# Patient Record
Sex: Male | Born: 1950
Health system: Southern US, Community
[De-identification: ages and names within clinical notes are randomized; demographics above are authoritative.]

## PROBLEM LIST (undated history)

## (undated) DIAGNOSIS — E119 Type 2 diabetes mellitus without complications: Secondary | ICD-10-CM

## (undated) DIAGNOSIS — K759 Inflammatory liver disease, unspecified: Secondary | ICD-10-CM

## (undated) DIAGNOSIS — Z87442 Personal history of urinary calculi: Secondary | ICD-10-CM

## (undated) DIAGNOSIS — Z889 Allergy status to unspecified drugs, medicaments and biological substances status: Secondary | ICD-10-CM

## (undated) DIAGNOSIS — M542 Cervicalgia: Secondary | ICD-10-CM

## (undated) DIAGNOSIS — I10 Essential (primary) hypertension: Secondary | ICD-10-CM

## (undated) DIAGNOSIS — Z87898 Personal history of other specified conditions: Secondary | ICD-10-CM

## (undated) DIAGNOSIS — K219 Gastro-esophageal reflux disease without esophagitis: Secondary | ICD-10-CM

## (undated) DIAGNOSIS — D649 Anemia, unspecified: Secondary | ICD-10-CM

## (undated) HISTORY — PX: TONSILLECTOMY: SUR1361

## (undated) HISTORY — PX: BACK SURGERY: SHX140

## (undated) HISTORY — PX: COLON SURGERY: SHX602

## (undated) HISTORY — PX: COLONOSCOPY W/ POLYPECTOMY: SHX1380

---

## 1999-05-04 ENCOUNTER — Encounter: Admission: RE | Admit: 1999-05-04 | Discharge: 1999-08-02 | Payer: Self-pay | Admitting: Internal Medicine

## 2009-01-11 ENCOUNTER — Emergency Department (HOSPITAL_COMMUNITY): Admission: EM | Admit: 2009-01-11 | Discharge: 2009-01-11 | Payer: Self-pay | Admitting: Emergency Medicine

## 2009-02-13 ENCOUNTER — Ambulatory Visit (HOSPITAL_COMMUNITY): Admission: RE | Admit: 2009-02-13 | Discharge: 2009-02-13 | Payer: Self-pay | Admitting: Gastroenterology

## 2010-04-13 LAB — GLUCOSE, CAPILLARY: Glucose-Capillary: 113 mg/dL — ABNORMAL HIGH (ref 70–99)

## 2010-04-27 LAB — POCT I-STAT, CHEM 8
BUN: 18 mg/dL (ref 6–23)
Calcium, Ion: 1.11 mmol/L — ABNORMAL LOW (ref 1.12–1.32)
Chloride: 103 mEq/L (ref 96–112)
Glucose, Bld: 206 mg/dL — ABNORMAL HIGH (ref 70–99)

## 2010-04-27 LAB — CBC
HCT: 42.6 % (ref 39.0–52.0)
Hemoglobin: 14.2 g/dL (ref 13.0–17.0)
RBC: 5.27 MIL/uL (ref 4.22–5.81)
WBC: 15.7 10*3/uL — ABNORMAL HIGH (ref 4.0–10.5)

## 2014-04-10 ENCOUNTER — Other Ambulatory Visit: Payer: Self-pay | Admitting: Internal Medicine

## 2014-04-10 DIAGNOSIS — R1033 Periumbilical pain: Secondary | ICD-10-CM

## 2014-04-17 ENCOUNTER — Ambulatory Visit
Admission: RE | Admit: 2014-04-17 | Discharge: 2014-04-17 | Disposition: A | Discharge status: Home / Self Care | Payer: 59 | Source: Ambulatory Visit | Attending: Internal Medicine | Admitting: Internal Medicine

## 2014-04-17 DIAGNOSIS — R1033 Periumbilical pain: Secondary | ICD-10-CM

## 2014-04-17 MED ORDER — IOPAMIDOL (ISOVUE-300) INJECTION 61%
100.0000 mL | Freq: Once | INTRAVENOUS | Status: AC | PRN
  Administered 2014-04-17: 100 mL via INTRAVENOUS

## 2014-04-28 ENCOUNTER — Emergency Department (HOSPITAL_COMMUNITY): Payer: 59

## 2014-04-28 ENCOUNTER — Encounter (HOSPITAL_COMMUNITY): Payer: Self-pay | Admitting: Emergency Medicine

## 2014-04-28 ENCOUNTER — Emergency Department (HOSPITAL_COMMUNITY)
Admission: EM | Admit: 2014-04-28 | Discharge: 2014-04-28 | Disposition: A | Discharge status: Home / Self Care | Payer: 59 | Attending: Emergency Medicine | Admitting: Emergency Medicine

## 2014-04-28 DIAGNOSIS — T148XXA Other injury of unspecified body region, initial encounter: Secondary | ICD-10-CM

## 2014-04-28 DIAGNOSIS — Y999 Unspecified external cause status: Secondary | ICD-10-CM | POA: Diagnosis not present

## 2014-04-28 DIAGNOSIS — S199XXA Unspecified injury of neck, initial encounter: Secondary | ICD-10-CM | POA: Insufficient documentation

## 2014-04-28 DIAGNOSIS — I10 Essential (primary) hypertension: Secondary | ICD-10-CM | POA: Diagnosis not present

## 2014-04-28 DIAGNOSIS — R55 Syncope and collapse: Secondary | ICD-10-CM | POA: Diagnosis present

## 2014-04-28 DIAGNOSIS — S61230A Puncture wound without foreign body of right index finger without damage to nail, initial encounter: Secondary | ICD-10-CM | POA: Diagnosis not present

## 2014-04-28 DIAGNOSIS — Z23 Encounter for immunization: Secondary | ICD-10-CM | POA: Insufficient documentation

## 2014-04-28 DIAGNOSIS — S0001XA Abrasion of scalp, initial encounter: Secondary | ICD-10-CM

## 2014-04-28 DIAGNOSIS — W228XXA Striking against or struck by other objects, initial encounter: Secondary | ICD-10-CM | POA: Diagnosis not present

## 2014-04-28 DIAGNOSIS — E119 Type 2 diabetes mellitus without complications: Secondary | ICD-10-CM | POA: Insufficient documentation

## 2014-04-28 DIAGNOSIS — D649 Anemia, unspecified: Secondary | ICD-10-CM | POA: Insufficient documentation

## 2014-04-28 DIAGNOSIS — Y929 Unspecified place or not applicable: Secondary | ICD-10-CM | POA: Insufficient documentation

## 2014-04-28 DIAGNOSIS — W01198A Fall on same level from slipping, tripping and stumbling with subsequent striking against other object, initial encounter: Secondary | ICD-10-CM | POA: Insufficient documentation

## 2014-04-28 DIAGNOSIS — Y939 Activity, unspecified: Secondary | ICD-10-CM | POA: Diagnosis not present

## 2014-04-28 DIAGNOSIS — Z880 Allergy status to penicillin: Secondary | ICD-10-CM | POA: Insufficient documentation

## 2014-04-28 HISTORY — DX: Type 2 diabetes mellitus without complications: E11.9

## 2014-04-28 HISTORY — DX: Essential (primary) hypertension: I10

## 2014-04-28 LAB — I-STAT CHEM 8, ED
BUN: 15 mg/dL (ref 6–23)
CHLORIDE: 104 mmol/L (ref 96–112)
Calcium, Ion: 1.11 mmol/L — ABNORMAL LOW (ref 1.13–1.30)
Creatinine, Ser: 0.9 mg/dL (ref 0.50–1.35)
GLUCOSE: 133 mg/dL — AB (ref 70–99)
HEMATOCRIT: 30 % — AB (ref 39.0–52.0)
HEMOGLOBIN: 10.2 g/dL — AB (ref 13.0–17.0)
POTASSIUM: 4.7 mmol/L (ref 3.5–5.1)
Sodium: 138 mmol/L (ref 135–145)
TCO2: 20 mmol/L (ref 0–100)

## 2014-04-28 MED ORDER — TRAMADOL HCL 50 MG PO TABS: 50.0000 mg | ORAL_TABLET | Freq: Four times a day (QID) | ORAL | Status: DC | PRN

## 2014-04-28 MED ORDER — ACETAMINOPHEN 325 MG PO TABS
650.0000 mg | ORAL_TABLET | Freq: Once | ORAL | Status: AC
  Administered 2014-04-28: 650 mg via ORAL
  Filled 2014-04-28: qty 2

## 2014-04-28 MED ORDER — TETANUS-DIPHTH-ACELL PERTUSSIS 5-2.5-18.5 LF-MCG/0.5 IM SUSP
0.5000 mL | Freq: Once | INTRAMUSCULAR | Status: AC
  Administered 2014-04-28: 0.5 mL via INTRAMUSCULAR
  Filled 2014-04-28: qty 0.5

## 2014-04-28 NOTE — ED Provider Notes (Signed)
CSN: [id number]     Arrival date & time 04/28/14  1305 History   First MD Initiated Contact with Patient 04/28/14 1308     Chief Complaint  Patient presents with  . Loss of Consciousness     (Consider location/radiation/quality/duration/timing/severity/associated sxs/prior Treatment) HPI Comments: Patient presents today with a chief complaint of syncope that occurred jpta.  He reports that he accidentally punctured himself in the right index finger with a piece of metal.  The finger than began bleeding.  He states that when he saw the blood he became lightheaded and nauseous.  This caused his to have a syncopal episode when he stood up.  When he had the syncopal episode he fell down and hit the back of his head on the floor.  He also thinks that he may have hit his right hand on something, but is unsure.  He is currently having pain of the back of his head, posterior neck, and right hand.  He denies numbness, tingling, vision changes, chest pain, SOB, or dizziness at this time.  He is not on any anticoagulants.  The history is provided by the patient.    Past Medical History  Diagnosis Date  . Diabetes mellitus without complication   . Hypertension    No past surgical history on file. No family history on file. History  Substance Use Topics  . Smoking status: Not on file  . Smokeless tobacco: Not on file  . Alcohol Use: Not on file    Review of Systems  All other systems reviewed and are negative.     Allergies  Penicillins and Sulfa antibiotics  Home Medications   Prior to Admission medications   Not on File   BP 117/65 mmHg  Temp(Src) 97.7 F (36.5 C) (Oral)  Resp 8  Ht 5' 9"  (1.753 m)  Wt 162 lb (73.483 kg)  BMI 23.91 kg/m2  SpO2 99% Physical Exam  Constitutional: He appears well-developed and well-nourished.  HENT:  Head: Normocephalic.    Mouth/Throat: Oropharynx is clear and moist.  Eyes: EOM are normal. Pupils are equal, round, and reactive to light.    Neck: Neck supple.  Patient in cervical collar.  Did not assess ROM of the neck.  Cardiovascular: Normal rate, regular rhythm and normal heart sounds.   Pulmonary/Chest: Effort normal and breath sounds normal.  Musculoskeletal: Normal range of motion.       Right wrist: He exhibits normal range of motion, no tenderness and no bony tenderness.       Cervical back: He exhibits tenderness and bony tenderness. He exhibits no swelling, no edema and no deformity.       Thoracic back: Normal. He exhibits normal range of motion, no tenderness, no bony tenderness, no swelling, no edema and no deformity.       Lumbar back: He exhibits normal range of motion, no tenderness, no bony tenderness, no swelling, no edema and no deformity.  Tenderness to palpation of the dorsal aspect of the right hand in the area of the 1st and 2nd metacarpals.   Cervical spinal tenderness to palpation.  No step offs or deformities.  No tenderness to palpation of thoracic or lumbar spine. Chronic hyperflexion deformity of the right 5th digit  Neurological: He is alert.  Distal sensation of both hands intact  Skin: Skin is warm and dry.  Small puncture wound to the base of the right index finger on the palmar surface.  No active bleeding.    Psychiatric: He has  a normal mood and affect.  Nursing note and vitals reviewed.   ED Course  Procedures (including critical care time) Labs Review Labs Reviewed  I-STAT CHEM 8, ED    Imaging Review Ct Head Wo Contrast  04/28/2014   CLINICAL DATA:  Syncope, head trauma  EXAM: CT HEAD WITHOUT CONTRAST  CT CERVICAL SPINE WITHOUT CONTRAST  TECHNIQUE: Multidetector CT imaging of the head and cervical spine was performed following the standard protocol without intravenous contrast. Multiplanar CT image reconstructions of the cervical spine were also generated.  COMPARISON:  None.  FINDINGS: CT HEAD FINDINGS  No acute hemorrhage, infarct, or mass lesion is identified. No midline shift.  Ventricles are normal in size. Orbits are unremarkable. No skull fracture. Mild pansinusitis noted.  CT CERVICAL SPINE FINDINGS  C1 through the cervicothoracic junction is visualized in its entirety. There is no evidence of cervical spine fracture or prevertebral soft tissue swelling. Alignment is normal. No other significant bone abnormalities are identified. Multilevel facet osteoarthritic change noted.  IMPRESSION: No acute intracranial finding.  Mild pansinusitis.  No acute cervical spine fracture or dislocation.   Electronically Signed   By: Conchita Paris M.D.   On: 04/28/2014 15:07   Ct Cervical Spine Wo Contrast  04/28/2014   CLINICAL DATA:  Syncope, head trauma  EXAM: CT HEAD WITHOUT CONTRAST  CT CERVICAL SPINE WITHOUT CONTRAST  TECHNIQUE: Multidetector CT imaging of the head and cervical spine was performed following the standard protocol without intravenous contrast. Multiplanar CT image reconstructions of the cervical spine were also generated.  COMPARISON:  None.  FINDINGS: CT HEAD FINDINGS  No acute hemorrhage, infarct, or mass lesion is identified. No midline shift. Ventricles are normal in size. Orbits are unremarkable. No skull fracture. Mild pansinusitis noted.  CT CERVICAL SPINE FINDINGS  C1 through the cervicothoracic junction is visualized in its entirety. There is no evidence of cervical spine fracture or prevertebral soft tissue swelling. Alignment is normal. No other significant bone abnormalities are identified. Multilevel facet osteoarthritic change noted.  IMPRESSION: No acute intracranial finding.  Mild pansinusitis.  No acute cervical spine fracture or dislocation.   Electronically Signed   By: Conchita Paris M.D.   On: 04/28/2014 15:07   Dg Hand Complete Right  04/28/2014   CLINICAL DATA:  Pain in finger, possible laceration. Loss of consciousness, possible vagal response given hypotension and bradycardia on the scene. Index finger pain.  EXAM: RIGHT HAND - COMPLETE 3+ VIEW   COMPARISON:  None.  FINDINGS: Small finger proximal interphalangeal joint is hyperflexed, query extensor tendon injury. Mild degenerative interphalangeal joint spurring. No bony abnormality of the index finger or foreign body identified.  IMPRESSION: 1. Hyperflexion of the small finger proximal interphalangeal joint - the finger may be fixed in this position, which would suggest an extensor tendon rupture or injury. 2. No foreign body or fracture identified.   Electronically Signed   By: Van Clines M.D.   On: 04/28/2014 14:39     EKG Interpretation   Date/Time:  Sunday April 28 2014 13:12:42 EDT Ventricular Rate:  67 PR Interval:  162 QRS Duration: 90 QT Interval:  431 QTC Calculation: 455 R Axis:   59 Text Interpretation:  Sinus rhythm No significant change since last  tracing Confirmed by Maryan Rued  MD, Loree Fee ([id number]) on 04/28/2014 1:31:31 PM      MDM   Final diagnoses:  None   Patient presents today after a syncopal event that occurred just prior to arrival.  Syncopal event occurred  after he unintentionally punctured his right index finger with a piece of metal.  History consistent with vasovagal syncope.  Patient does not have any dizziness while in the ED.  He also denies chest pain or SOB.  No ischemic changes on EKG.  He did hit his head when he fell from a standing position and sustained an abrasion to the posterior scalp.  He is currently not on anticoagulants.  Normal neurological exam.  Head CT is negative.  He is also complaining of neck pain and cervical spine was tender to palpation.  CT cervical spine is also negative for acute findings.  Xray of right hand without acute findings.  Patient does have a chronic hyperflexion injury to the right 5th digit that occurred when he was 64 years old that is seen on the xray today.  Patient found to be anemic with a hemoglobin of 10.2.  Patient reports that this is baseline for him.  Anemia is being treated with B12 injections and  Iron that has been prescribed by his PCP.  He denies any rectal bleeding.  Feel that the patient is stable for discharge.  Return precautions given.     Hyman Bible, PA-C 04/28/14 [place], MD 05/02/14 [phone]

## 2014-04-28 NOTE — ED Notes (Signed)
Pt cut finger out at coliseium and pt loss consciousness. Was pouring sweat and very clammy when EMS got on scene. Pt was hypotensive and bradycardic. Pt's VSS and feels better.

## 2014-04-28 NOTE — ED Notes (Signed)
Pt returned from scans without distress.

## 2014-05-01 ENCOUNTER — Other Ambulatory Visit: Payer: Self-pay | Admitting: Gastroenterology

## 2014-05-02 ENCOUNTER — Encounter (HOSPITAL_COMMUNITY): Payer: Self-pay | Admitting: *Deleted

## 2014-05-07 ENCOUNTER — Ambulatory Visit (HOSPITAL_COMMUNITY): Payer: 59 | Admitting: Anesthesiology

## 2014-05-07 ENCOUNTER — Encounter (HOSPITAL_COMMUNITY): Payer: Self-pay | Admitting: Gastroenterology

## 2014-05-07 ENCOUNTER — Ambulatory Visit (HOSPITAL_COMMUNITY)
Admission: RE | Admit: 2014-05-07 | Discharge: 2014-05-07 | Disposition: A | Discharge status: Home / Self Care | Payer: 59 | Source: Ambulatory Visit | Attending: Gastroenterology | Admitting: Gastroenterology

## 2014-05-07 ENCOUNTER — Encounter (HOSPITAL_COMMUNITY)
Admission: RE | Disposition: A | Discharge status: Home / Self Care | Payer: Self-pay | Source: Ambulatory Visit | Attending: Gastroenterology

## 2014-05-07 DIAGNOSIS — K219 Gastro-esophageal reflux disease without esophagitis: Secondary | ICD-10-CM | POA: Insufficient documentation

## 2014-05-07 DIAGNOSIS — K2960 Other gastritis without bleeding: Secondary | ICD-10-CM | POA: Diagnosis not present

## 2014-05-07 DIAGNOSIS — I10 Essential (primary) hypertension: Secondary | ICD-10-CM | POA: Insufficient documentation

## 2014-05-07 DIAGNOSIS — E538 Deficiency of other specified B group vitamins: Secondary | ICD-10-CM | POA: Diagnosis not present

## 2014-05-07 DIAGNOSIS — N400 Enlarged prostate without lower urinary tract symptoms: Secondary | ICD-10-CM | POA: Insufficient documentation

## 2014-05-07 DIAGNOSIS — Z7982 Long term (current) use of aspirin: Secondary | ICD-10-CM | POA: Insufficient documentation

## 2014-05-07 DIAGNOSIS — Z86010 Personal history of colonic polyps: Secondary | ICD-10-CM | POA: Insufficient documentation

## 2014-05-07 DIAGNOSIS — D509 Iron deficiency anemia, unspecified: Secondary | ICD-10-CM | POA: Diagnosis not present

## 2014-05-07 DIAGNOSIS — K529 Noninfective gastroenteritis and colitis, unspecified: Secondary | ICD-10-CM | POA: Insufficient documentation

## 2014-05-07 DIAGNOSIS — J45909 Unspecified asthma, uncomplicated: Secondary | ICD-10-CM | POA: Diagnosis not present

## 2014-05-07 DIAGNOSIS — Z8379 Family history of other diseases of the digestive system: Secondary | ICD-10-CM | POA: Insufficient documentation

## 2014-05-07 DIAGNOSIS — Z79899 Other long term (current) drug therapy: Secondary | ICD-10-CM | POA: Insufficient documentation

## 2014-05-07 DIAGNOSIS — E119 Type 2 diabetes mellitus without complications: Secondary | ICD-10-CM | POA: Diagnosis not present

## 2014-05-07 HISTORY — DX: Anemia, unspecified: D64.9

## 2014-05-07 HISTORY — PX: ESOPHAGOGASTRODUODENOSCOPY (EGD) WITH PROPOFOL: SHX5813

## 2014-05-07 HISTORY — DX: Personal history of other specified conditions: Z87.898

## 2014-05-07 HISTORY — DX: Cervicalgia: M54.2

## 2014-05-07 HISTORY — DX: Allergy status to unspecified drugs, medicaments and biological substances: Z88.9

## 2014-05-07 HISTORY — PX: COLONOSCOPY WITH PROPOFOL: SHX5780

## 2014-05-07 HISTORY — DX: Gastro-esophageal reflux disease without esophagitis: K21.9

## 2014-05-07 LAB — GLUCOSE, CAPILLARY: GLUCOSE-CAPILLARY: 110 mg/dL — AB (ref 70–99)

## 2014-05-07 SURGERY — COLONOSCOPY WITH PROPOFOL
Anesthesia: Monitor Anesthesia Care

## 2014-05-07 MED ORDER — PROPOFOL 10 MG/ML IV BOLUS
INTRAVENOUS | Status: DC | PRN
  Administered 2014-05-07 (×4): 20 mg via INTRAVENOUS
  Administered 2014-05-07: 30 mg via INTRAVENOUS
  Administered 2014-05-07: 50 mg via INTRAVENOUS
  Administered 2014-05-07: 30 mg via INTRAVENOUS
  Administered 2014-05-07: 40 mg via INTRAVENOUS
  Administered 2014-05-07: 30 mg via INTRAVENOUS
  Administered 2014-05-07: 20 mg via INTRAVENOUS
  Administered 2014-05-07: 30 mg via INTRAVENOUS
  Administered 2014-05-07 (×2): 20 mg via INTRAVENOUS
  Administered 2014-05-07: 10 mg via INTRAVENOUS
  Administered 2014-05-07: 40 mg via INTRAVENOUS
  Administered 2014-05-07: 20 mg via INTRAVENOUS
  Administered 2014-05-07: 30 mg via INTRAVENOUS

## 2014-05-07 MED ORDER — PROPOFOL 10 MG/ML IV BOLUS
INTRAVENOUS | Status: AC
  Filled 2014-05-07: qty 20

## 2014-05-07 MED ORDER — PROPOFOL 10 MG/ML IV BOLUS
INTRAVENOUS | Status: AC
Start: 2014-05-07 — End: 2014-05-07
  Filled 2014-05-07: qty 20

## 2014-05-07 MED ORDER — SODIUM CHLORIDE 0.9 % IV SOLN: INTRAVENOUS | Status: DC

## 2014-05-07 MED ORDER — BUTAMBEN-TETRACAINE-BENZOCAINE 2-2-14 % EX AERO
INHALATION_SPRAY | CUTANEOUS | Status: DC | PRN
  Administered 2014-05-07: 1 via TOPICAL

## 2014-05-07 MED ORDER — LACTATED RINGERS IV SOLN
INTRAVENOUS | Status: DC
  Administered 2014-05-07: 1000 mL via INTRAVENOUS

## 2014-05-07 SURGICAL SUPPLY — 25 items

## 2014-05-07 NOTE — Op Note (Signed)
Problems: "Ileitis" diagnosed by CT scan of the abdomen-pelvis, iron deficiency anemia on aspirin, vitamin B 12 deficiency, daughter diagnosed with Crohn's disease. Positive H. pylori serology treated in 1995  Endoscopist: Earle Gell  Premedication: Propofol administered by anesthesia  Procedure: Diagnostic esophagogastroduodenoscopy with small bowel biopsies and gastric biopsies The patient was placed in the left lateral decubitus position. The Pentax gastroscope was passed through the posterior hypopharynx into the proximal esophagus without difficulty. The hypopharynx, larynx, and vocal cords appeared normal.  Esophagoscopy: The proximal, mid, and lower segments of the esophageal mucosa appeared normal. The squamocolumnar junction appeared regular and was noted at 40 cm from the incisor teeth.  Gastroscopy: Retroflex view of the gastric cardia and fundus was normal. The gastric body, antrum, and pylorus appeared normal. Random biopsies were taken from the gastric antrum, gastric body, and gastric cardia for H. pylori gastritis  Duodenoscopy: The duodenal bulb and descending duodenum appeared normal. Five biopsies were taken from the second portion and third portion of the duodenum to look for Crohn's disease and villous atrophy associated with celiac disease  Assessment: Normal esophagogastroduodenoscopy. Small bowel biopsies performed to look for villous atrophy associated with celiac disease and Crohn's disease. Random gastric biopsies performed to look for H. pylori gastritis  Procedure: Diagnostic colonoscopy with terminal ileal biopsies Anal inspection and digital rectal exam were normal. The Pentax pediatric colonoscope was introduced into the rectum and advanced to the cecum. A normal-appearing appendiceal orifice was identified. A normal-appearing ileocecal valve was intubated and the terminal ileum inspected. Colonic preparation for the exam today was good. Withdrawal time was 17  minutes  Rectum. Normal. Retroflexed view of the distal rectum normal  Sigmoid colon and descending colon. Normal  Splenic flexure. Normal  Transverse colon. Normal  Hepatic flexure. Normal  Ascending colon. Normal  Cecum and ileocecal valve. Normal  Terminal ileum: The terminal ileal mucosa appeared mildly inflamed without discrete aphthous ulcers. There was a stricture of the terminal ileum and I was unable to traverse the stricture with the pediatric colonoscope. Terminal ileal biopsies were performed.  Assessment: Normal colonoscopy. Distal ileal stricture with mild distal ileitis with ileal biopsies pending.  Recommendation: Schedule surveillance colonoscopy in 5 years. Performed barium small bowel follow-through x-ray series

## 2014-05-07 NOTE — Anesthesia Preprocedure Evaluation (Signed)
Anesthesia Evaluation  Patient identified by MRN, date of birth, ID band Patient awake    Reviewed: Allergy & Precautions, NPO status , Patient's Chart, lab work & pertinent test results  Airway Mallampati: II  TM Distance: >3 FB Neck ROM: Full    Dental no notable dental hx.    Pulmonary neg pulmonary ROS,  breath sounds clear to auscultation  Pulmonary exam normal       Cardiovascular hypertension, Rhythm:Regular Rate:Normal     Neuro/Psych negative neurological ROS  negative psych ROS   GI/Hepatic negative GI ROS, Neg liver ROS, GERD-  ,  Endo/Other  negative endocrine ROSdiabetes  Renal/GU negative Renal ROS  negative genitourinary   Musculoskeletal negative musculoskeletal ROS (+)   Abdominal   Peds negative pediatric ROS (+)  Hematology  (+) anemia ,   Anesthesia Other Findings   Reproductive/Obstetrics negative OB ROS                             Anesthesia Physical Anesthesia Plan  ASA: III  Anesthesia Plan: MAC   Post-op Pain Management:    Induction: Intravenous  Airway Management Planned: Simple Face Mask  Additional Equipment:   Intra-op Plan:   Post-operative Plan:   Informed Consent: I have reviewed the patients History and Physical, chart, labs and discussed the procedure including the risks, benefits and alternatives for the proposed anesthesia with the patient or authorized representative who has indicated his/her understanding and acceptance.   Dental advisory given  Plan Discussed with: CRNA and Surgeon  Anesthesia Plan Comments:         Anesthesia Quick Evaluation

## 2014-05-07 NOTE — H&P (Signed)
  Problems: Ileitis diagnosed by CT scan, iron deficiency anemia on aspirin, vitamin B 12 deficiency. 04/17/2014 CT scan abdomen-pelvis showed mucosal edema of the distal and terminal ileum. 03/29/2011 normal surveillance colonoscopy. Daughter diagnosed with Crohn's disease. Differential diagnosis of ileitis includes inflammatory bowel disease, infection (tuberculosis, bacteria, fungi), lymphoid hyperplasia, lymphoma, carcinoid tumor, adenocarcinoma, leiomyosarcoma, nonsteroidal anti-inflammatory drug-induced mucosal injury, vasculitis, amyloidosis, eosinophilic gastroenteritis, systemic mastocytosis.  History: The patient is a 64 year old male born [birth year]. His daughter has Crohn's disease. The patient underwent a CT scan of the abdomen and pelvis to evaluate abdominal pain and diarrhea, CT scan of the abdomen and pelvis showed "inflammation" of the mid-terminal ileum consistent with inflammatory bowel disease.  The patient was also diagnosed with iron deficiency anemia and vitamin B 12 deficiency.  He takes aspirin 81 mg on a daily basis but denies gastrointestinal bleeding.  He is scheduled to undergo diagnostic esophagogastroduodenoscopy with small bowel biopsies to look for villous atrophy, and diagnostic colonoscopy to perform terminal ileal biopsies.  Past medical history: Type 2 diabetes mellitus. Hypertension. Benign prostatic hypertrophy. Positive H. pylori antibody treated in July 1995. Allergic rhinitis-asthma. Hepatitis in 1974. Diminutive adenomatous colon polyp removed in 2008. Nasal surgery. Lumbar disc surgery.  Family history: Daughter diagnosed with Crohn's disease  Medication allergies: Sulfa drugs cause rash. Penicillin causes hives. Metformin causes diarrhea.  Exam: The patient is alert and lying comfortably on the endoscopy stretcher. Abdomen is soft and nontender to palpation. Lungs are clear to auscultation. Cardiac exam reveals a regular rhythm.  Plan: Proceed with  diagnostic esophagogastroduodenoscopy and colonoscopy

## 2014-05-07 NOTE — Transfer of Care (Signed)
Immediate Anesthesia Transfer of Care Note  Patient: Eric Moody  Procedure(s) Performed: Procedure(s): COLONOSCOPY WITH PROPOFOL (N/A) ESOPHAGOGASTRODUODENOSCOPY (EGD) WITH PROPOFOL (N/A)  Patient Location: PACU  Anesthesia Type:MAC  Level of Consciousness: sedated  Airway & Oxygen Therapy: Patient Spontanous Breathing and Patient connected to nasal cannula oxygen  Post-op Assessment: Report given to RN and Post -op Vital signs reviewed and stable  Post vital signs: Reviewed and stable  Last Vitals:  Filed Vitals:   05/07/14 0713  BP: 157/83  Pulse: 71  Temp: 36.9 C  Resp: 18    Complications: No apparent anesthesia complications

## 2014-05-07 NOTE — Anesthesia Postprocedure Evaluation (Signed)
  Anesthesia Post-op Note  Patient: Eric Moody  Procedure(s) Performed: Procedure(s) (LRB): COLONOSCOPY WITH PROPOFOL (N/A) ESOPHAGOGASTRODUODENOSCOPY (EGD) WITH PROPOFOL (N/A)  Patient Location: PACU  Anesthesia Type: MAC  Level of Consciousness: awake and alert   Airway and Oxygen Therapy: Patient Spontanous Breathing  Post-op Pain: mild  Post-op Assessment: Post-op Vital signs reviewed, Patient's Cardiovascular Status Stable, Respiratory Function Stable, Patent Airway and No signs of Nausea or vomiting  Last Vitals:  Filed Vitals:   05/07/14 0913  BP: 116/76  Pulse: 72  Temp: 37 C  Resp: 14    Post-op Vital Signs: stable   Complications: No apparent anesthesia complications

## 2014-05-07 NOTE — Discharge Instructions (Signed)
Colonoscopy, Care After Refer to this sheet in the next few weeks. These instructions provide you with information on caring for yourself after your procedure. Your health care provider may also give you more specific instructions. Your treatment has been planned according to current medical practices, but problems sometimes occur. Call your health care provider if you have any problems or questions after your procedure. WHAT TO EXPECT AFTER THE PROCEDURE  After your procedure, it is typical to have the following:  A small amount of blood in your stool.  Moderate amounts of gas and mild abdominal cramping or bloating. HOME CARE INSTRUCTIONS  Do not drive, operate machinery, or sign important documents for 24 hours.  You may shower and resume your regular physical activities, but move at a slower pace for the first 24 hours.  Take frequent rest periods for the first 24 hours.  Walk around or put a warm pack on your abdomen to help reduce abdominal cramping and bloating.  Drink enough fluids to keep your urine clear or pale yellow.  You may resume your normal diet as instructed by your health care provider. Avoid heavy or fried foods that are hard to digest.  Avoid drinking alcohol for 24 hours or as instructed by your health care provider.  Only take over-the-counter or prescription medicines as directed by your health care provider.  If a tissue sample (biopsy) was taken during your procedure:  Do not take aspirin or blood thinners for 7 days, or as instructed by your health care provider.  Do not drink alcohol for 7 days, or as instructed by your health care provider.  Eat soft foods for the first 24 hours. SEEK MEDICAL CARE IF: You have persistent spotting of blood in your stool 2-3 days after the procedure. SEEK IMMEDIATE MEDICAL CARE IF:  You have more than a small spotting of blood in your stool.  You pass large blood clots in your stool.  Your abdomen is swollen  (distended).  You have nausea or vomiting.  You have a fever.  You have increasing abdominal pain that is not relieved with medicine. Document Released: 08/26/2003 Document Revised: 11/01/2012 Document Reviewed: 09/18/2012 [facility] Patient Information 2015 [place], [place]. This information is not intended to replace advice given to you by your health care provider. Make sure you discuss any questions you have with your health care provider.

## 2014-05-08 ENCOUNTER — Encounter (HOSPITAL_COMMUNITY): Payer: Self-pay | Admitting: Gastroenterology

## 2014-05-14 ENCOUNTER — Other Ambulatory Visit: Payer: Self-pay | Admitting: Gastroenterology

## 2014-05-17 ENCOUNTER — Other Ambulatory Visit: Payer: Self-pay | Admitting: Gastroenterology

## 2014-05-17 DIAGNOSIS — K5000 Crohn's disease of small intestine without complications: Secondary | ICD-10-CM

## 2014-05-22 ENCOUNTER — Ambulatory Visit
Admission: RE | Admit: 2014-05-22 | Discharge: 2014-05-22 | Disposition: A | Discharge status: Home / Self Care | Payer: 59 | Source: Ambulatory Visit | Attending: Gastroenterology | Admitting: Gastroenterology

## 2014-05-22 DIAGNOSIS — K5000 Crohn's disease of small intestine without complications: Secondary | ICD-10-CM

## 2014-08-29 ENCOUNTER — Other Ambulatory Visit (HOSPITAL_COMMUNITY): Payer: Self-pay | Admitting: *Deleted

## 2014-08-30 ENCOUNTER — Ambulatory Visit (HOSPITAL_COMMUNITY)
Admission: RE | Admit: 2014-08-30 | Discharge: 2014-08-30 | Disposition: A | Discharge status: Home / Self Care | Payer: 59 | Source: Ambulatory Visit | Attending: Gastroenterology | Admitting: Gastroenterology

## 2014-08-30 DIAGNOSIS — K5000 Crohn's disease of small intestine without complications: Secondary | ICD-10-CM | POA: Insufficient documentation

## 2014-08-30 MED ORDER — ACETAMINOPHEN 325 MG PO TABS
ORAL_TABLET | ORAL | Status: AC
  Administered 2014-08-30: 650 mg
  Filled 2014-08-30: qty 2

## 2014-08-30 MED ORDER — SODIUM CHLORIDE 0.9 % IV SOLN
400.0000 mg | INTRAVENOUS | Status: DC
  Administered 2014-08-30: 400 mg via INTRAVENOUS
  Filled 2014-08-30: qty 40

## 2014-08-30 MED ORDER — METHYLPREDNISOLONE SODIUM SUCC 40 MG IJ SOLR
INTRAMUSCULAR | Status: AC
  Administered 2014-08-30: 20 mg
  Filled 2014-08-30: qty 1

## 2014-08-30 MED ORDER — ACETAMINOPHEN 325 MG PO TABS: 650.0000 mg | ORAL_TABLET | ORAL | Status: DC

## 2014-08-30 MED ORDER — SODIUM CHLORIDE 0.9 % IV SOLN: INTRAVENOUS | Status: DC

## 2014-08-30 MED ORDER — METHYLPREDNISOLONE SODIUM SUCC 125 MG IJ SOLR: 20.0000 mg | INTRAMUSCULAR | Status: DC

## 2014-08-30 NOTE — Discharge Instructions (Signed)
Infliximab injection What is this medicine? INFLIXIMAB (in [place] i mab) is used to treat Crohn's disease and ulcerative colitis. It is also used to treat ankylosing spondylitis, psoriasis, and some forms of arthritis. This medicine may be used for other purposes; ask your health care provider or pharmacist if you have questions. COMMON BRAND NAME(S): Remicade What should I tell my health care provider before I take this medicine? They need to know if you have any of these conditions: -diabetes -exposure to tuberculosis -heart failure -hepatitis or liver disease -immune system problems -infection -lung or breathing disease, like COPD -multiple sclerosis -current or past resident of [place] or [place] -seizure disorder -an unusual or allergic reaction to infliximab, mouse proteins, other medicines, foods, dyes, or preservatives -pregnant or trying to get pregnant -breast-feeding How should I use this medicine? This medicine is for injection into a vein. It is usually given by a health care professional in a hospital or clinic setting. A special MedGuide will be given to you by the pharmacist with each prescription and refill. Be sure to read this information carefully each time. Talk to your pediatrician regarding the use of this medicine in children. Special care may be needed. Overdosage: If you think you have taken too much of this medicine contact a poison control center or emergency room at once. NOTE: This medicine is only for you. Do not share this medicine with others. What if I miss a dose? It is important not to miss your dose. Call your doctor or health care professional if you are unable to keep an appointment. What may interact with this medicine? Do not take this medicine with any of the following medications: -anakinra -rilonacept This medicine may also interact with the following medications: -vaccines This list may not describe all possible interactions.  Give your health care provider a list of all the medicines, herbs, non-prescription drugs, or dietary supplements you use. Also tell them if you smoke, drink alcohol, or use illegal drugs. Some items may interact with your medicine. What should I watch for while using this medicine? Visit your doctor or health care professional for regular checks on your progress. If you get a cold or other infection while receiving this medicine, call your doctor or health care professional. Do not treat yourself. This medicine may decrease your body's ability to fight infections. Before beginning therapy, your doctor may do a test to see if you have been exposed to tuberculosis. This medicine may make the symptoms of heart failure worse in some patients. If you notice symptoms such as increased shortness of breath or swelling of the ankles or legs, contact your health care provider right away. If you are going to have surgery or dental work, tell your health care professional or dentist that you have received this medicine. If you take this medicine for plaque psoriasis, stay out of the sun. If you cannot avoid being in the sun, wear protective clothing and use sunscreen. Do not use sun lamps or tanning beds/booths. What side effects may I notice from receiving this medicine? Side effects that you should report to your doctor or health care professional as soon as possible: -allergic reactions like skin rash, itching or hives, swelling of the face, lips, or tongue -chest pain -fever or chills, usually related to the infusion -muscle or joint pain -red, scaly patches or raised bumps on the skin -signs of infection - fever or chills, cough, sore throat, pain or difficulty passing urine -swollen lymph nodes  in the neck, underarm, or groin areas -unexplained weight loss -unusual bleeding or bruising -unusually weak or tired -yellowing of the eyes or skin Side effects that usually do not require medical attention  (report to your doctor or health care professional if they continue or are bothersome): -headache -heartburn or stomach pain -nausea, vomiting This list may not describe all possible side effects. Call your doctor for medical advice about side effects. You may report side effects to FDA at 1-800-FDA-1088. Where should I keep my medicine? This drug is given in a hospital or clinic and will not be stored at home. NOTE: This sheet is a summary. It may not cover all possible information. If you have questions about this medicine, talk to your doctor, pharmacist, or health care provider.  2015, [employer]vier/[employer] Standard. (2007-08-30 10:26:02)

## 2014-09-13 ENCOUNTER — Encounter (HOSPITAL_COMMUNITY)
Admission: RE | Admit: 2014-09-13 | Discharge: 2014-09-13 | Disposition: A | Discharge status: Home / Self Care | Payer: 59 | Source: Ambulatory Visit | Attending: Gastroenterology | Admitting: Gastroenterology

## 2014-09-13 DIAGNOSIS — K5000 Crohn's disease of small intestine without complications: Secondary | ICD-10-CM | POA: Diagnosis present

## 2014-09-13 MED ORDER — ACETAMINOPHEN 325 MG PO TABS
ORAL_TABLET | ORAL | Status: AC
  Filled 2014-09-13: qty 2

## 2014-09-13 MED ORDER — SODIUM CHLORIDE 0.9 % IV SOLN
400.0000 mg | INTRAVENOUS | Status: DC
  Administered 2014-09-13: 400 mg via INTRAVENOUS
  Filled 2014-09-13: qty 40

## 2014-09-13 MED ORDER — METHYLPREDNISOLONE SODIUM SUCC 125 MG IJ SOLR: 20.0000 mg | INTRAMUSCULAR | Status: DC

## 2014-09-13 MED ORDER — SODIUM CHLORIDE 0.9 % IV SOLN
INTRAVENOUS | Status: DC
Start: 2014-09-13 — End: 2014-09-14
  Administered 2014-09-13: 09:00:00 via INTRAVENOUS

## 2014-09-13 MED ORDER — METHYLPREDNISOLONE SODIUM SUCC 40 MG IJ SOLR
INTRAMUSCULAR | Status: AC
  Administered 2014-09-13: 20 mg
  Filled 2014-09-13: qty 1

## 2014-09-13 MED ORDER — ACETAMINOPHEN 325 MG PO TABS
650.0000 mg | ORAL_TABLET | ORAL | Status: DC
Start: 2014-09-13 — End: 2014-09-14
  Administered 2014-09-13: 650 mg via ORAL

## 2014-10-25 ENCOUNTER — Encounter (HOSPITAL_COMMUNITY)
Admission: RE | Admit: 2014-10-25 | Discharge: 2014-10-25 | Disposition: A | Discharge status: Home / Self Care | Payer: 59 | Source: Ambulatory Visit | Attending: Gastroenterology | Admitting: Gastroenterology

## 2014-10-25 DIAGNOSIS — K5000 Crohn's disease of small intestine without complications: Secondary | ICD-10-CM | POA: Diagnosis not present

## 2014-10-25 MED ORDER — ACETAMINOPHEN 325 MG PO TABS: 650.0000 mg | ORAL_TABLET | ORAL | Status: DC

## 2014-10-25 MED ORDER — SODIUM CHLORIDE 0.9 % IV SOLN
400.0000 mg | INTRAVENOUS | Status: DC
  Administered 2014-10-25: 400 mg via INTRAVENOUS
  Filled 2014-10-25: qty 40

## 2014-10-25 MED ORDER — METHYLPREDNISOLONE SODIUM SUCC 125 MG IJ SOLR: 20.0000 mg | INTRAMUSCULAR | Status: DC

## 2014-10-25 MED ORDER — SODIUM CHLORIDE 0.9 % IV SOLN
INTRAVENOUS | Status: DC
  Administered 2014-10-25: 08:00:00 via INTRAVENOUS

## 2014-10-25 MED ORDER — METHYLPREDNISOLONE SODIUM SUCC 40 MG IJ SOLR
INTRAMUSCULAR | Status: AC
  Administered 2014-10-25: 20 mg
  Filled 2014-10-25: qty 1

## 2014-11-22 ENCOUNTER — Encounter (HOSPITAL_COMMUNITY)
Admission: RE | Admit: 2014-11-22 | Discharge: 2014-11-22 | Disposition: A | Discharge status: Home / Self Care | Payer: 59 | Source: Ambulatory Visit | Attending: Gastroenterology | Admitting: Gastroenterology

## 2014-11-22 DIAGNOSIS — K5000 Crohn's disease of small intestine without complications: Secondary | ICD-10-CM | POA: Diagnosis not present

## 2014-11-22 MED ORDER — METHYLPREDNISOLONE SODIUM SUCC 125 MG IJ SOLR: 20.0000 mg | INTRAMUSCULAR | Status: DC

## 2014-11-22 MED ORDER — ACETAMINOPHEN 325 MG PO TABS: 650.0000 mg | ORAL_TABLET | ORAL | Status: DC

## 2014-11-22 MED ORDER — SODIUM CHLORIDE 0.9 % IV SOLN
400.0000 mg | INTRAVENOUS | Status: DC
  Filled 2014-11-22: qty 40

## 2014-11-22 MED ORDER — METHYLPREDNISOLONE SODIUM SUCC 40 MG IJ SOLR
INTRAMUSCULAR | Status: AC
  Administered 2014-11-22: 20 mg via INTRAVENOUS
  Filled 2014-11-22: qty 1

## 2014-11-22 MED ORDER — SODIUM CHLORIDE 0.9 % IV SOLN
400.0000 mg | INTRAVENOUS | Status: AC
  Administered 2014-11-22: 400 mg via INTRAVENOUS
  Filled 2014-11-22: qty 40

## 2014-11-22 MED ORDER — SODIUM CHLORIDE 0.9 % IV SOLN
INTRAVENOUS | Status: DC
  Administered 2014-11-22: 09:00:00 via INTRAVENOUS

## 2014-11-22 MED ORDER — METHYLPREDNISOLONE SODIUM SUCC 40 MG IJ SOLR: 20.0000 mg | INTRAMUSCULAR | Status: DC

## 2014-12-23 ENCOUNTER — Other Ambulatory Visit (HOSPITAL_COMMUNITY): Payer: Self-pay | Admitting: *Deleted

## 2014-12-24 ENCOUNTER — Encounter (HOSPITAL_COMMUNITY): Payer: 59

## 2015-01-23 ENCOUNTER — Encounter (HOSPITAL_COMMUNITY)
Admission: RE | Admit: 2015-01-23 | Discharge: 2015-01-23 | Disposition: A | Discharge status: Home / Self Care | Payer: 59 | Source: Ambulatory Visit | Attending: Gastroenterology | Admitting: Gastroenterology

## 2015-01-23 DIAGNOSIS — K5000 Crohn's disease of small intestine without complications: Secondary | ICD-10-CM | POA: Diagnosis present

## 2015-01-23 MED ORDER — METHYLPREDNISOLONE SODIUM SUCC 40 MG IJ SOLR: 20.0000 mg | INTRAMUSCULAR | Status: DC

## 2015-01-23 MED ORDER — SODIUM CHLORIDE 0.9 % IV SOLN
400.0000 mg | INTRAVENOUS | Status: DC
  Administered 2015-01-23: 400 mg via INTRAVENOUS
  Filled 2015-01-23: qty 40

## 2015-01-23 MED ORDER — ACETAMINOPHEN 325 MG PO TABS
650.0000 mg | ORAL_TABLET | ORAL | Status: DC
Start: 2015-01-23 — End: 2015-01-24

## 2015-01-23 MED ORDER — SODIUM CHLORIDE 0.9 % IV SOLN
INTRAVENOUS | Status: DC
  Administered 2015-01-23: 09:00:00 via INTRAVENOUS

## 2015-03-20 ENCOUNTER — Encounter (HOSPITAL_COMMUNITY): Payer: 59

## 2016-04-07 DIAGNOSIS — I10 Essential (primary) hypertension: Secondary | ICD-10-CM | POA: Diagnosis not present

## 2016-04-07 DIAGNOSIS — K219 Gastro-esophageal reflux disease without esophagitis: Secondary | ICD-10-CM | POA: Diagnosis not present

## 2016-04-07 DIAGNOSIS — E611 Iron deficiency: Secondary | ICD-10-CM | POA: Diagnosis not present

## 2016-04-07 DIAGNOSIS — E538 Deficiency of other specified B group vitamins: Secondary | ICD-10-CM | POA: Diagnosis not present

## 2016-04-07 DIAGNOSIS — Z7984 Long term (current) use of oral hypoglycemic drugs: Secondary | ICD-10-CM | POA: Diagnosis not present

## 2016-04-07 DIAGNOSIS — E119 Type 2 diabetes mellitus without complications: Secondary | ICD-10-CM | POA: Diagnosis not present

## 2016-04-14 DIAGNOSIS — K5000 Crohn's disease of small intestine without complications: Secondary | ICD-10-CM | POA: Diagnosis not present

## 2016-04-14 DIAGNOSIS — E538 Deficiency of other specified B group vitamins: Secondary | ICD-10-CM | POA: Diagnosis not present

## 2016-07-08 DIAGNOSIS — Z794 Long term (current) use of insulin: Secondary | ICD-10-CM | POA: Diagnosis not present

## 2016-07-08 DIAGNOSIS — H524 Presbyopia: Secondary | ICD-10-CM | POA: Diagnosis not present

## 2016-07-08 DIAGNOSIS — Z7984 Long term (current) use of oral hypoglycemic drugs: Secondary | ICD-10-CM | POA: Diagnosis not present

## 2016-07-08 DIAGNOSIS — E119 Type 2 diabetes mellitus without complications: Secondary | ICD-10-CM | POA: Diagnosis not present

## 2016-07-08 DIAGNOSIS — H5213 Myopia, bilateral: Secondary | ICD-10-CM | POA: Diagnosis not present

## 2016-07-08 DIAGNOSIS — H52223 Regular astigmatism, bilateral: Secondary | ICD-10-CM | POA: Diagnosis not present

## 2016-10-07 DIAGNOSIS — Z0000 Encounter for general adult medical examination without abnormal findings: Secondary | ICD-10-CM | POA: Diagnosis not present

## 2016-10-07 DIAGNOSIS — I10 Essential (primary) hypertension: Secondary | ICD-10-CM | POA: Diagnosis not present

## 2016-10-07 DIAGNOSIS — Z7984 Long term (current) use of oral hypoglycemic drugs: Secondary | ICD-10-CM | POA: Diagnosis not present

## 2016-10-07 DIAGNOSIS — E119 Type 2 diabetes mellitus without complications: Secondary | ICD-10-CM | POA: Diagnosis not present

## 2016-10-07 DIAGNOSIS — K219 Gastro-esophageal reflux disease without esophagitis: Secondary | ICD-10-CM | POA: Diagnosis not present

## 2016-10-07 DIAGNOSIS — E1165 Type 2 diabetes mellitus with hyperglycemia: Secondary | ICD-10-CM | POA: Diagnosis not present

## 2016-10-07 DIAGNOSIS — Z23 Encounter for immunization: Secondary | ICD-10-CM | POA: Diagnosis not present

## 2016-10-07 DIAGNOSIS — N529 Male erectile dysfunction, unspecified: Secondary | ICD-10-CM | POA: Diagnosis not present

## 2016-10-07 DIAGNOSIS — K5000 Crohn's disease of small intestine without complications: Secondary | ICD-10-CM | POA: Diagnosis not present

## 2016-10-07 DIAGNOSIS — D509 Iron deficiency anemia, unspecified: Secondary | ICD-10-CM | POA: Diagnosis not present

## 2016-10-07 DIAGNOSIS — Z1389 Encounter for screening for other disorder: Secondary | ICD-10-CM | POA: Diagnosis not present

## 2016-10-15 DIAGNOSIS — L237 Allergic contact dermatitis due to plants, except food: Secondary | ICD-10-CM | POA: Diagnosis not present

## 2016-10-21 DIAGNOSIS — K5000 Crohn's disease of small intestine without complications: Secondary | ICD-10-CM | POA: Diagnosis not present

## 2016-11-24 DIAGNOSIS — N400 Enlarged prostate without lower urinary tract symptoms: Secondary | ICD-10-CM | POA: Diagnosis not present

## 2016-11-24 DIAGNOSIS — E119 Type 2 diabetes mellitus without complications: Secondary | ICD-10-CM | POA: Diagnosis not present

## 2016-11-24 DIAGNOSIS — J4520 Mild intermittent asthma, uncomplicated: Secondary | ICD-10-CM | POA: Diagnosis not present

## 2016-11-24 DIAGNOSIS — D509 Iron deficiency anemia, unspecified: Secondary | ICD-10-CM | POA: Diagnosis not present

## 2016-11-24 DIAGNOSIS — I10 Essential (primary) hypertension: Secondary | ICD-10-CM | POA: Diagnosis not present

## 2016-11-26 DIAGNOSIS — Z7984 Long term (current) use of oral hypoglycemic drugs: Secondary | ICD-10-CM | POA: Diagnosis not present

## 2016-11-26 DIAGNOSIS — D509 Iron deficiency anemia, unspecified: Secondary | ICD-10-CM | POA: Diagnosis not present

## 2016-11-26 DIAGNOSIS — E119 Type 2 diabetes mellitus without complications: Secondary | ICD-10-CM | POA: Diagnosis not present

## 2016-11-26 DIAGNOSIS — I10 Essential (primary) hypertension: Secondary | ICD-10-CM | POA: Diagnosis not present

## 2016-11-26 DIAGNOSIS — J4520 Mild intermittent asthma, uncomplicated: Secondary | ICD-10-CM | POA: Diagnosis not present

## 2016-11-26 DIAGNOSIS — N400 Enlarged prostate without lower urinary tract symptoms: Secondary | ICD-10-CM | POA: Diagnosis not present

## 2017-01-16 DIAGNOSIS — J0100 Acute maxillary sinusitis, unspecified: Secondary | ICD-10-CM | POA: Diagnosis not present

## 2017-03-24 DIAGNOSIS — E119 Type 2 diabetes mellitus without complications: Secondary | ICD-10-CM | POA: Diagnosis not present

## 2017-03-24 DIAGNOSIS — N400 Enlarged prostate without lower urinary tract symptoms: Secondary | ICD-10-CM | POA: Diagnosis not present

## 2017-03-24 DIAGNOSIS — D509 Iron deficiency anemia, unspecified: Secondary | ICD-10-CM | POA: Diagnosis not present

## 2017-03-24 DIAGNOSIS — I10 Essential (primary) hypertension: Secondary | ICD-10-CM | POA: Diagnosis not present

## 2017-03-24 DIAGNOSIS — J4520 Mild intermittent asthma, uncomplicated: Secondary | ICD-10-CM | POA: Diagnosis not present

## 2017-03-24 DIAGNOSIS — Z7984 Long term (current) use of oral hypoglycemic drugs: Secondary | ICD-10-CM | POA: Diagnosis not present

## 2017-04-08 DIAGNOSIS — E1165 Type 2 diabetes mellitus with hyperglycemia: Secondary | ICD-10-CM | POA: Diagnosis not present

## 2017-04-08 DIAGNOSIS — Z794 Long term (current) use of insulin: Secondary | ICD-10-CM | POA: Diagnosis not present

## 2017-04-08 DIAGNOSIS — E119 Type 2 diabetes mellitus without complications: Secondary | ICD-10-CM | POA: Diagnosis not present

## 2017-04-08 DIAGNOSIS — I10 Essential (primary) hypertension: Secondary | ICD-10-CM | POA: Diagnosis not present

## 2017-04-08 DIAGNOSIS — K219 Gastro-esophageal reflux disease without esophagitis: Secondary | ICD-10-CM | POA: Diagnosis not present

## 2017-05-06 DIAGNOSIS — K5000 Crohn's disease of small intestine without complications: Secondary | ICD-10-CM | POA: Diagnosis not present

## 2017-05-06 DIAGNOSIS — K219 Gastro-esophageal reflux disease without esophagitis: Secondary | ICD-10-CM | POA: Diagnosis not present

## 2017-05-06 DIAGNOSIS — Z86010 Personal history of colonic polyps: Secondary | ICD-10-CM | POA: Diagnosis not present

## 2017-07-04 DIAGNOSIS — J4520 Mild intermittent asthma, uncomplicated: Secondary | ICD-10-CM | POA: Diagnosis not present

## 2017-08-10 DIAGNOSIS — H52223 Regular astigmatism, bilateral: Secondary | ICD-10-CM | POA: Diagnosis not present

## 2017-08-10 DIAGNOSIS — H0100B Unspecified blepharitis left eye, upper and lower eyelids: Secondary | ICD-10-CM | POA: Diagnosis not present

## 2017-08-10 DIAGNOSIS — H5213 Myopia, bilateral: Secondary | ICD-10-CM | POA: Diagnosis not present

## 2017-08-10 DIAGNOSIS — H0100A Unspecified blepharitis right eye, upper and lower eyelids: Secondary | ICD-10-CM | POA: Diagnosis not present

## 2017-08-10 DIAGNOSIS — E119 Type 2 diabetes mellitus without complications: Secondary | ICD-10-CM | POA: Diagnosis not present

## 2017-08-10 DIAGNOSIS — H25813 Combined forms of age-related cataract, bilateral: Secondary | ICD-10-CM | POA: Diagnosis not present

## 2017-08-10 DIAGNOSIS — Z794 Long term (current) use of insulin: Secondary | ICD-10-CM | POA: Diagnosis not present

## 2017-10-11 DIAGNOSIS — Z125 Encounter for screening for malignant neoplasm of prostate: Secondary | ICD-10-CM | POA: Diagnosis not present

## 2017-10-11 DIAGNOSIS — D509 Iron deficiency anemia, unspecified: Secondary | ICD-10-CM | POA: Diagnosis not present

## 2017-10-11 DIAGNOSIS — Z1389 Encounter for screening for other disorder: Secondary | ICD-10-CM | POA: Diagnosis not present

## 2017-10-11 DIAGNOSIS — K219 Gastro-esophageal reflux disease without esophagitis: Secondary | ICD-10-CM | POA: Diagnosis not present

## 2017-10-11 DIAGNOSIS — E1169 Type 2 diabetes mellitus with other specified complication: Secondary | ICD-10-CM | POA: Diagnosis not present

## 2017-10-11 DIAGNOSIS — Z23 Encounter for immunization: Secondary | ICD-10-CM | POA: Diagnosis not present

## 2017-10-11 DIAGNOSIS — I10 Essential (primary) hypertension: Secondary | ICD-10-CM | POA: Diagnosis not present

## 2017-10-11 DIAGNOSIS — Z0000 Encounter for general adult medical examination without abnormal findings: Secondary | ICD-10-CM | POA: Diagnosis not present

## 2017-11-10 DIAGNOSIS — Z86010 Personal history of colonic polyps: Secondary | ICD-10-CM | POA: Diagnosis not present

## 2017-11-10 DIAGNOSIS — Z79899 Other long term (current) drug therapy: Secondary | ICD-10-CM | POA: Diagnosis not present

## 2017-11-10 DIAGNOSIS — K219 Gastro-esophageal reflux disease without esophagitis: Secondary | ICD-10-CM | POA: Diagnosis not present

## 2017-11-10 DIAGNOSIS — K5000 Crohn's disease of small intestine without complications: Secondary | ICD-10-CM | POA: Diagnosis not present

## 2018-01-14 DIAGNOSIS — J0100 Acute maxillary sinusitis, unspecified: Secondary | ICD-10-CM | POA: Diagnosis not present

## 2018-02-08 ENCOUNTER — Other Ambulatory Visit: Payer: Self-pay | Admitting: Physician Assistant

## 2018-02-08 DIAGNOSIS — E559 Vitamin D deficiency, unspecified: Secondary | ICD-10-CM | POA: Diagnosis not present

## 2018-02-08 DIAGNOSIS — E538 Deficiency of other specified B group vitamins: Secondary | ICD-10-CM | POA: Diagnosis not present

## 2018-02-08 DIAGNOSIS — R1031 Right lower quadrant pain: Secondary | ICD-10-CM | POA: Diagnosis not present

## 2018-02-08 DIAGNOSIS — R197 Diarrhea, unspecified: Secondary | ICD-10-CM | POA: Diagnosis not present

## 2018-02-08 DIAGNOSIS — K5000 Crohn's disease of small intestine without complications: Secondary | ICD-10-CM | POA: Diagnosis not present

## 2018-02-08 DIAGNOSIS — D649 Anemia, unspecified: Secondary | ICD-10-CM | POA: Diagnosis not present

## 2018-02-08 DIAGNOSIS — K50019 Crohn's disease of small intestine with unspecified complications: Secondary | ICD-10-CM

## 2018-02-09 ENCOUNTER — Ambulatory Visit
Admission: RE | Admit: 2018-02-09 | Discharge: 2018-02-09 | Disposition: A | Discharge status: Home / Self Care | Payer: Medicare Other | Source: Ambulatory Visit | Attending: Physician Assistant | Admitting: Physician Assistant

## 2018-02-09 DIAGNOSIS — K50019 Crohn's disease of small intestine with unspecified complications: Secondary | ICD-10-CM

## 2018-02-09 DIAGNOSIS — R1031 Right lower quadrant pain: Secondary | ICD-10-CM

## 2018-02-09 DIAGNOSIS — K5000 Crohn's disease of small intestine without complications: Secondary | ICD-10-CM | POA: Diagnosis not present

## 2018-02-09 DIAGNOSIS — R197 Diarrhea, unspecified: Secondary | ICD-10-CM | POA: Diagnosis not present

## 2018-02-09 MED ORDER — IOPAMIDOL (ISOVUE-300) INJECTION 61%
100.0000 mL | Freq: Once | INTRAVENOUS | Status: AC | PRN
  Administered 2018-02-09: 100 mL via INTRAVENOUS

## 2018-02-14 ENCOUNTER — Other Ambulatory Visit: Payer: Self-pay

## 2018-03-01 DIAGNOSIS — K5000 Crohn's disease of small intestine without complications: Secondary | ICD-10-CM | POA: Diagnosis not present

## 2018-03-01 DIAGNOSIS — K219 Gastro-esophageal reflux disease without esophagitis: Secondary | ICD-10-CM | POA: Diagnosis not present

## 2018-03-01 DIAGNOSIS — Z86010 Personal history of colonic polyps: Secondary | ICD-10-CM | POA: Diagnosis not present

## 2018-03-06 DIAGNOSIS — K5000 Crohn's disease of small intestine without complications: Secondary | ICD-10-CM | POA: Diagnosis not present

## 2018-05-11 DIAGNOSIS — H11442 Conjunctival cysts, left eye: Secondary | ICD-10-CM | POA: Diagnosis not present

## 2018-06-20 DIAGNOSIS — M9901 Segmental and somatic dysfunction of cervical region: Secondary | ICD-10-CM | POA: Diagnosis not present

## 2018-06-20 DIAGNOSIS — M62830 Muscle spasm of back: Secondary | ICD-10-CM | POA: Diagnosis not present

## 2018-06-20 DIAGNOSIS — M9902 Segmental and somatic dysfunction of thoracic region: Secondary | ICD-10-CM | POA: Diagnosis not present

## 2018-06-20 DIAGNOSIS — M5412 Radiculopathy, cervical region: Secondary | ICD-10-CM | POA: Diagnosis not present

## 2018-06-21 DIAGNOSIS — M5412 Radiculopathy, cervical region: Secondary | ICD-10-CM | POA: Diagnosis not present

## 2018-06-21 DIAGNOSIS — M9902 Segmental and somatic dysfunction of thoracic region: Secondary | ICD-10-CM | POA: Diagnosis not present

## 2018-06-21 DIAGNOSIS — M9901 Segmental and somatic dysfunction of cervical region: Secondary | ICD-10-CM | POA: Diagnosis not present

## 2018-06-21 DIAGNOSIS — M62830 Muscle spasm of back: Secondary | ICD-10-CM | POA: Diagnosis not present

## 2018-06-26 DIAGNOSIS — M62830 Muscle spasm of back: Secondary | ICD-10-CM | POA: Diagnosis not present

## 2018-06-26 DIAGNOSIS — M5412 Radiculopathy, cervical region: Secondary | ICD-10-CM | POA: Diagnosis not present

## 2018-06-26 DIAGNOSIS — M9902 Segmental and somatic dysfunction of thoracic region: Secondary | ICD-10-CM | POA: Diagnosis not present

## 2018-06-26 DIAGNOSIS — M9901 Segmental and somatic dysfunction of cervical region: Secondary | ICD-10-CM | POA: Diagnosis not present

## 2018-06-27 DIAGNOSIS — M5412 Radiculopathy, cervical region: Secondary | ICD-10-CM | POA: Diagnosis not present

## 2018-06-27 DIAGNOSIS — M9902 Segmental and somatic dysfunction of thoracic region: Secondary | ICD-10-CM | POA: Diagnosis not present

## 2018-06-27 DIAGNOSIS — M62830 Muscle spasm of back: Secondary | ICD-10-CM | POA: Diagnosis not present

## 2018-06-27 DIAGNOSIS — M9901 Segmental and somatic dysfunction of cervical region: Secondary | ICD-10-CM | POA: Diagnosis not present

## 2018-06-28 DIAGNOSIS — M5412 Radiculopathy, cervical region: Secondary | ICD-10-CM | POA: Diagnosis not present

## 2018-06-28 DIAGNOSIS — M9901 Segmental and somatic dysfunction of cervical region: Secondary | ICD-10-CM | POA: Diagnosis not present

## 2018-06-28 DIAGNOSIS — M62830 Muscle spasm of back: Secondary | ICD-10-CM | POA: Diagnosis not present

## 2018-06-28 DIAGNOSIS — M9902 Segmental and somatic dysfunction of thoracic region: Secondary | ICD-10-CM | POA: Diagnosis not present

## 2018-06-30 DIAGNOSIS — D509 Iron deficiency anemia, unspecified: Secondary | ICD-10-CM | POA: Diagnosis not present

## 2018-06-30 DIAGNOSIS — Z7984 Long term (current) use of oral hypoglycemic drugs: Secondary | ICD-10-CM | POA: Diagnosis not present

## 2018-06-30 DIAGNOSIS — I10 Essential (primary) hypertension: Secondary | ICD-10-CM | POA: Diagnosis not present

## 2018-06-30 DIAGNOSIS — E538 Deficiency of other specified B group vitamins: Secondary | ICD-10-CM | POA: Diagnosis not present

## 2018-06-30 DIAGNOSIS — E1169 Type 2 diabetes mellitus with other specified complication: Secondary | ICD-10-CM | POA: Diagnosis not present

## 2018-06-30 DIAGNOSIS — K5000 Crohn's disease of small intestine without complications: Secondary | ICD-10-CM | POA: Diagnosis not present

## 2018-06-30 DIAGNOSIS — M5412 Radiculopathy, cervical region: Secondary | ICD-10-CM | POA: Diagnosis not present

## 2018-07-03 DIAGNOSIS — M5412 Radiculopathy, cervical region: Secondary | ICD-10-CM | POA: Diagnosis not present

## 2018-07-03 DIAGNOSIS — M9901 Segmental and somatic dysfunction of cervical region: Secondary | ICD-10-CM | POA: Diagnosis not present

## 2018-07-03 DIAGNOSIS — M9902 Segmental and somatic dysfunction of thoracic region: Secondary | ICD-10-CM | POA: Diagnosis not present

## 2018-07-03 DIAGNOSIS — M62830 Muscle spasm of back: Secondary | ICD-10-CM | POA: Diagnosis not present

## 2018-07-04 DIAGNOSIS — M5412 Radiculopathy, cervical region: Secondary | ICD-10-CM | POA: Diagnosis not present

## 2018-07-04 DIAGNOSIS — M9901 Segmental and somatic dysfunction of cervical region: Secondary | ICD-10-CM | POA: Diagnosis not present

## 2018-07-04 DIAGNOSIS — M9902 Segmental and somatic dysfunction of thoracic region: Secondary | ICD-10-CM | POA: Diagnosis not present

## 2018-07-04 DIAGNOSIS — M62830 Muscle spasm of back: Secondary | ICD-10-CM | POA: Diagnosis not present

## 2018-07-05 DIAGNOSIS — M62830 Muscle spasm of back: Secondary | ICD-10-CM | POA: Diagnosis not present

## 2018-07-05 DIAGNOSIS — M5412 Radiculopathy, cervical region: Secondary | ICD-10-CM | POA: Diagnosis not present

## 2018-07-05 DIAGNOSIS — M9902 Segmental and somatic dysfunction of thoracic region: Secondary | ICD-10-CM | POA: Diagnosis not present

## 2018-07-05 DIAGNOSIS — M9901 Segmental and somatic dysfunction of cervical region: Secondary | ICD-10-CM | POA: Diagnosis not present

## 2018-07-10 DIAGNOSIS — M9901 Segmental and somatic dysfunction of cervical region: Secondary | ICD-10-CM | POA: Diagnosis not present

## 2018-07-10 DIAGNOSIS — M62830 Muscle spasm of back: Secondary | ICD-10-CM | POA: Diagnosis not present

## 2018-07-10 DIAGNOSIS — M5412 Radiculopathy, cervical region: Secondary | ICD-10-CM | POA: Diagnosis not present

## 2018-07-10 DIAGNOSIS — M9902 Segmental and somatic dysfunction of thoracic region: Secondary | ICD-10-CM | POA: Diagnosis not present

## 2018-07-11 DIAGNOSIS — M9901 Segmental and somatic dysfunction of cervical region: Secondary | ICD-10-CM | POA: Diagnosis not present

## 2018-07-11 DIAGNOSIS — M5412 Radiculopathy, cervical region: Secondary | ICD-10-CM | POA: Diagnosis not present

## 2018-07-11 DIAGNOSIS — M9902 Segmental and somatic dysfunction of thoracic region: Secondary | ICD-10-CM | POA: Diagnosis not present

## 2018-07-11 DIAGNOSIS — M62830 Muscle spasm of back: Secondary | ICD-10-CM | POA: Diagnosis not present

## 2018-07-12 DIAGNOSIS — M5412 Radiculopathy, cervical region: Secondary | ICD-10-CM | POA: Diagnosis not present

## 2018-07-12 DIAGNOSIS — M9902 Segmental and somatic dysfunction of thoracic region: Secondary | ICD-10-CM | POA: Diagnosis not present

## 2018-07-12 DIAGNOSIS — M9901 Segmental and somatic dysfunction of cervical region: Secondary | ICD-10-CM | POA: Diagnosis not present

## 2018-07-12 DIAGNOSIS — M62830 Muscle spasm of back: Secondary | ICD-10-CM | POA: Diagnosis not present

## 2018-07-17 DIAGNOSIS — M9901 Segmental and somatic dysfunction of cervical region: Secondary | ICD-10-CM | POA: Diagnosis not present

## 2018-07-17 DIAGNOSIS — Z86010 Personal history of colonic polyps: Secondary | ICD-10-CM | POA: Diagnosis not present

## 2018-07-17 DIAGNOSIS — M62830 Muscle spasm of back: Secondary | ICD-10-CM | POA: Diagnosis not present

## 2018-07-17 DIAGNOSIS — K5000 Crohn's disease of small intestine without complications: Secondary | ICD-10-CM | POA: Diagnosis not present

## 2018-07-17 DIAGNOSIS — M5412 Radiculopathy, cervical region: Secondary | ICD-10-CM | POA: Diagnosis not present

## 2018-07-17 DIAGNOSIS — K219 Gastro-esophageal reflux disease without esophagitis: Secondary | ICD-10-CM | POA: Diagnosis not present

## 2018-07-17 DIAGNOSIS — M9902 Segmental and somatic dysfunction of thoracic region: Secondary | ICD-10-CM | POA: Diagnosis not present

## 2018-07-18 DIAGNOSIS — M5412 Radiculopathy, cervical region: Secondary | ICD-10-CM | POA: Diagnosis not present

## 2018-07-18 DIAGNOSIS — M9901 Segmental and somatic dysfunction of cervical region: Secondary | ICD-10-CM | POA: Diagnosis not present

## 2018-07-18 DIAGNOSIS — M9902 Segmental and somatic dysfunction of thoracic region: Secondary | ICD-10-CM | POA: Diagnosis not present

## 2018-07-18 DIAGNOSIS — M62830 Muscle spasm of back: Secondary | ICD-10-CM | POA: Diagnosis not present

## 2018-07-26 DIAGNOSIS — K5000 Crohn's disease of small intestine without complications: Secondary | ICD-10-CM | POA: Diagnosis not present

## 2018-07-29 DIAGNOSIS — R109 Unspecified abdominal pain: Secondary | ICD-10-CM | POA: Diagnosis not present

## 2018-10-09 DIAGNOSIS — K219 Gastro-esophageal reflux disease without esophagitis: Secondary | ICD-10-CM | POA: Diagnosis not present

## 2018-10-09 DIAGNOSIS — K5000 Crohn's disease of small intestine without complications: Secondary | ICD-10-CM | POA: Diagnosis not present

## 2018-10-09 DIAGNOSIS — Z86010 Personal history of colonic polyps: Secondary | ICD-10-CM | POA: Diagnosis not present

## 2018-10-09 DIAGNOSIS — R634 Abnormal weight loss: Secondary | ICD-10-CM | POA: Diagnosis not present

## 2018-10-23 DIAGNOSIS — E1169 Type 2 diabetes mellitus with other specified complication: Secondary | ICD-10-CM | POA: Diagnosis not present

## 2018-10-23 DIAGNOSIS — E559 Vitamin D deficiency, unspecified: Secondary | ICD-10-CM | POA: Diagnosis not present

## 2018-10-23 DIAGNOSIS — Z1389 Encounter for screening for other disorder: Secondary | ICD-10-CM | POA: Diagnosis not present

## 2018-10-23 DIAGNOSIS — Z0000 Encounter for general adult medical examination without abnormal findings: Secondary | ICD-10-CM | POA: Diagnosis not present

## 2018-10-23 DIAGNOSIS — K219 Gastro-esophageal reflux disease without esophagitis: Secondary | ICD-10-CM | POA: Diagnosis not present

## 2018-10-23 DIAGNOSIS — Z7984 Long term (current) use of oral hypoglycemic drugs: Secondary | ICD-10-CM | POA: Diagnosis not present

## 2018-10-23 DIAGNOSIS — K5000 Crohn's disease of small intestine without complications: Secondary | ICD-10-CM | POA: Diagnosis not present

## 2018-10-23 DIAGNOSIS — Z23 Encounter for immunization: Secondary | ICD-10-CM | POA: Diagnosis not present

## 2018-10-23 DIAGNOSIS — I10 Essential (primary) hypertension: Secondary | ICD-10-CM | POA: Diagnosis not present

## 2018-11-09 DIAGNOSIS — H5213 Myopia, bilateral: Secondary | ICD-10-CM | POA: Diagnosis not present

## 2018-11-09 DIAGNOSIS — H40051 Ocular hypertension, right eye: Secondary | ICD-10-CM | POA: Diagnosis not present

## 2018-11-09 DIAGNOSIS — H524 Presbyopia: Secondary | ICD-10-CM | POA: Diagnosis not present

## 2018-11-09 DIAGNOSIS — E119 Type 2 diabetes mellitus without complications: Secondary | ICD-10-CM | POA: Diagnosis not present

## 2018-11-09 DIAGNOSIS — H52223 Regular astigmatism, bilateral: Secondary | ICD-10-CM | POA: Diagnosis not present

## 2018-11-09 DIAGNOSIS — H25813 Combined forms of age-related cataract, bilateral: Secondary | ICD-10-CM | POA: Diagnosis not present

## 2018-11-13 DIAGNOSIS — K219 Gastro-esophageal reflux disease without esophagitis: Secondary | ICD-10-CM | POA: Diagnosis not present

## 2018-11-13 DIAGNOSIS — Z86010 Personal history of colonic polyps: Secondary | ICD-10-CM | POA: Diagnosis not present

## 2018-11-13 DIAGNOSIS — K5000 Crohn's disease of small intestine without complications: Secondary | ICD-10-CM | POA: Diagnosis not present

## 2018-11-23 DIAGNOSIS — D72829 Elevated white blood cell count, unspecified: Secondary | ICD-10-CM | POA: Diagnosis not present

## 2018-12-11 DIAGNOSIS — Z1159 Encounter for screening for other viral diseases: Secondary | ICD-10-CM | POA: Diagnosis not present

## 2018-12-14 DIAGNOSIS — D122 Benign neoplasm of ascending colon: Secondary | ICD-10-CM | POA: Diagnosis not present

## 2018-12-14 DIAGNOSIS — K5730 Diverticulosis of large intestine without perforation or abscess without bleeding: Secondary | ICD-10-CM | POA: Diagnosis not present

## 2018-12-14 DIAGNOSIS — K6289 Other specified diseases of anus and rectum: Secondary | ICD-10-CM | POA: Diagnosis not present

## 2018-12-14 DIAGNOSIS — D124 Benign neoplasm of descending colon: Secondary | ICD-10-CM | POA: Diagnosis not present

## 2018-12-14 DIAGNOSIS — D225 Melanocytic nevi of trunk: Secondary | ICD-10-CM | POA: Diagnosis not present

## 2018-12-14 DIAGNOSIS — K644 Residual hemorrhoidal skin tags: Secondary | ICD-10-CM | POA: Diagnosis not present

## 2018-12-14 DIAGNOSIS — K50019 Crohn's disease of small intestine with unspecified complications: Secondary | ICD-10-CM | POA: Diagnosis not present

## 2018-12-15 DIAGNOSIS — K5289 Other specified noninfective gastroenteritis and colitis: Secondary | ICD-10-CM | POA: Diagnosis not present

## 2018-12-15 DIAGNOSIS — K6389 Other specified diseases of intestine: Secondary | ICD-10-CM | POA: Diagnosis not present

## 2018-12-20 DIAGNOSIS — D124 Benign neoplasm of descending colon: Secondary | ICD-10-CM | POA: Diagnosis not present

## 2018-12-20 DIAGNOSIS — D122 Benign neoplasm of ascending colon: Secondary | ICD-10-CM | POA: Diagnosis not present

## 2018-12-20 DIAGNOSIS — D225 Melanocytic nevi of trunk: Secondary | ICD-10-CM | POA: Diagnosis not present

## 2018-12-20 DIAGNOSIS — K50019 Crohn's disease of small intestine with unspecified complications: Secondary | ICD-10-CM | POA: Diagnosis not present

## 2019-01-09 DIAGNOSIS — Z7984 Long term (current) use of oral hypoglycemic drugs: Secondary | ICD-10-CM | POA: Diagnosis not present

## 2019-01-09 DIAGNOSIS — E1169 Type 2 diabetes mellitus with other specified complication: Secondary | ICD-10-CM | POA: Diagnosis not present

## 2019-01-09 DIAGNOSIS — I10 Essential (primary) hypertension: Secondary | ICD-10-CM | POA: Diagnosis not present

## 2019-01-09 DIAGNOSIS — J4520 Mild intermittent asthma, uncomplicated: Secondary | ICD-10-CM | POA: Diagnosis not present

## 2019-01-09 DIAGNOSIS — D509 Iron deficiency anemia, unspecified: Secondary | ICD-10-CM | POA: Diagnosis not present

## 2019-01-09 DIAGNOSIS — E7800 Pure hypercholesterolemia, unspecified: Secondary | ICD-10-CM | POA: Diagnosis not present

## 2019-01-09 DIAGNOSIS — N400 Enlarged prostate without lower urinary tract symptoms: Secondary | ICD-10-CM | POA: Diagnosis not present

## 2019-01-26 DIAGNOSIS — D649 Anemia, unspecified: Secondary | ICD-10-CM

## 2019-01-26 HISTORY — DX: Anemia, unspecified: D64.9

## 2019-02-07 ENCOUNTER — Other Ambulatory Visit: Payer: Self-pay | Admitting: Gastroenterology

## 2019-02-07 DIAGNOSIS — K5000 Crohn's disease of small intestine without complications: Secondary | ICD-10-CM

## 2019-02-16 ENCOUNTER — Ambulatory Visit
Admission: RE | Admit: 2019-02-16 | Discharge: 2019-02-16 | Disposition: A | Discharge status: Home / Self Care | Payer: Medicare Other | Source: Ambulatory Visit | Attending: Gastroenterology | Admitting: Gastroenterology

## 2019-02-16 ENCOUNTER — Other Ambulatory Visit: Payer: Self-pay

## 2019-02-16 DIAGNOSIS — K5000 Crohn's disease of small intestine without complications: Secondary | ICD-10-CM

## 2019-02-16 DIAGNOSIS — K5710 Diverticulosis of small intestine without perforation or abscess without bleeding: Secondary | ICD-10-CM | POA: Diagnosis not present

## 2019-02-16 DIAGNOSIS — K6389 Other specified diseases of intestine: Secondary | ICD-10-CM | POA: Diagnosis not present

## 2019-02-21 ENCOUNTER — Other Ambulatory Visit: Payer: No Typology Code available for payment source

## 2019-02-21 ENCOUNTER — Ambulatory Visit: Payer: No Typology Code available for payment source

## 2019-02-22 DIAGNOSIS — E1169 Type 2 diabetes mellitus with other specified complication: Secondary | ICD-10-CM | POA: Diagnosis not present

## 2019-02-22 DIAGNOSIS — K5000 Crohn's disease of small intestine without complications: Secondary | ICD-10-CM | POA: Diagnosis not present

## 2019-02-22 DIAGNOSIS — I10 Essential (primary) hypertension: Secondary | ICD-10-CM | POA: Diagnosis not present

## 2019-02-22 DIAGNOSIS — K219 Gastro-esophageal reflux disease without esophagitis: Secondary | ICD-10-CM | POA: Diagnosis not present

## 2019-02-23 DIAGNOSIS — R899 Unspecified abnormal finding in specimens from other organs, systems and tissues: Secondary | ICD-10-CM | POA: Diagnosis not present

## 2019-02-23 DIAGNOSIS — E1169 Type 2 diabetes mellitus with other specified complication: Secondary | ICD-10-CM | POA: Diagnosis not present

## 2019-03-02 ENCOUNTER — Ambulatory Visit: Payer: Medicare Other | Attending: Internal Medicine

## 2019-03-02 DIAGNOSIS — Z23 Encounter for immunization: Secondary | ICD-10-CM

## 2019-03-02 NOTE — Progress Notes (Signed)
   Covid-19 Vaccination Clinic  Name:  Eric Moody    MRN: [id number] DOB: 1950/10/10  03/02/2019  Eric Moody was observed post Covid-19 immunization for 15 minutes without incidence. He was provided with Vaccine Information Sheet and instruction to access the V-Safe system.   Eric Moody was instructed to call 911 with any severe reactions post vaccine: Marland Kitchen Difficulty breathing  . Swelling of your face and throat  . A fast heartbeat  . A bad rash all over your body  . Dizziness and weakness    Immunizations Administered    Name Date Dose VIS Date Route   Pfizer COVID-19 Vaccine 03/02/2019  2:14 PM 0.3 mL 01/05/2019 Intramuscular   Manufacturer: [place]   Lot: [id number]   [place]: [address]-7

## 2019-03-14 ENCOUNTER — Ambulatory Visit: Payer: No Typology Code available for payment source

## 2019-03-23 ENCOUNTER — Inpatient Hospital Stay (HOSPITAL_COMMUNITY)
Admission: EM | Admit: 2019-03-23 | Discharge: 2019-03-31 | DRG: 386 | Disposition: A | Discharge status: Home / Self Care | Payer: Medicare Other | Attending: Internal Medicine | Admitting: Internal Medicine

## 2019-03-23 ENCOUNTER — Observation Stay (HOSPITAL_COMMUNITY): Payer: Medicare Other

## 2019-03-23 ENCOUNTER — Ambulatory Visit
Admission: RE | Admit: 2019-03-23 | Discharge: 2019-03-23 | Disposition: A | Discharge status: Home / Self Care | Payer: Medicare Other | Source: Ambulatory Visit | Attending: Gastroenterology | Admitting: Gastroenterology

## 2019-03-23 ENCOUNTER — Encounter (HOSPITAL_COMMUNITY): Payer: Self-pay

## 2019-03-23 ENCOUNTER — Other Ambulatory Visit: Payer: Self-pay

## 2019-03-23 ENCOUNTER — Other Ambulatory Visit: Payer: Self-pay | Admitting: Gastroenterology

## 2019-03-23 ENCOUNTER — Emergency Department (HOSPITAL_COMMUNITY): Payer: Medicare Other

## 2019-03-23 DIAGNOSIS — I10 Essential (primary) hypertension: Secondary | ICD-10-CM | POA: Diagnosis present

## 2019-03-23 DIAGNOSIS — T380X5A Adverse effect of glucocorticoids and synthetic analogues, initial encounter: Secondary | ICD-10-CM | POA: Diagnosis present

## 2019-03-23 DIAGNOSIS — Z20822 Contact with and (suspected) exposure to covid-19: Secondary | ICD-10-CM | POA: Diagnosis present

## 2019-03-23 DIAGNOSIS — K219 Gastro-esophageal reflux disease without esophagitis: Secondary | ICD-10-CM | POA: Diagnosis present

## 2019-03-23 DIAGNOSIS — K56609 Unspecified intestinal obstruction, unspecified as to partial versus complete obstruction: Secondary | ICD-10-CM

## 2019-03-23 DIAGNOSIS — Z7984 Long term (current) use of oral hypoglycemic drugs: Secondary | ICD-10-CM

## 2019-03-23 DIAGNOSIS — R899 Unspecified abnormal finding in specimens from other organs, systems and tissues: Secondary | ICD-10-CM | POA: Diagnosis not present

## 2019-03-23 DIAGNOSIS — R109 Unspecified abdominal pain: Secondary | ICD-10-CM | POA: Diagnosis not present

## 2019-03-23 DIAGNOSIS — K50012 Crohn's disease of small intestine with intestinal obstruction: Principal | ICD-10-CM | POA: Diagnosis present

## 2019-03-23 DIAGNOSIS — Z882 Allergy status to sulfonamides status: Secondary | ICD-10-CM

## 2019-03-23 DIAGNOSIS — E876 Hypokalemia: Secondary | ICD-10-CM | POA: Diagnosis present

## 2019-03-23 DIAGNOSIS — Z4682 Encounter for fitting and adjustment of non-vascular catheter: Secondary | ICD-10-CM | POA: Diagnosis not present

## 2019-03-23 DIAGNOSIS — K50019 Crohn's disease of small intestine with unspecified complications: Secondary | ICD-10-CM | POA: Diagnosis not present

## 2019-03-23 DIAGNOSIS — E440 Moderate protein-calorie malnutrition: Secondary | ICD-10-CM | POA: Diagnosis present

## 2019-03-23 DIAGNOSIS — N200 Calculus of kidney: Secondary | ICD-10-CM | POA: Diagnosis not present

## 2019-03-23 DIAGNOSIS — Z880 Allergy status to penicillin: Secondary | ICD-10-CM

## 2019-03-23 DIAGNOSIS — E872 Acidosis: Secondary | ICD-10-CM | POA: Diagnosis not present

## 2019-03-23 DIAGNOSIS — D509 Iron deficiency anemia, unspecified: Secondary | ICD-10-CM | POA: Diagnosis present

## 2019-03-23 DIAGNOSIS — R1084 Generalized abdominal pain: Secondary | ICD-10-CM | POA: Diagnosis not present

## 2019-03-23 DIAGNOSIS — Z0189 Encounter for other specified special examinations: Secondary | ICD-10-CM

## 2019-03-23 DIAGNOSIS — K50912 Crohn's disease, unspecified, with intestinal obstruction: Secondary | ICD-10-CM | POA: Diagnosis not present

## 2019-03-23 DIAGNOSIS — Z79899 Other long term (current) drug therapy: Secondary | ICD-10-CM

## 2019-03-23 DIAGNOSIS — J302 Other seasonal allergic rhinitis: Secondary | ICD-10-CM | POA: Diagnosis present

## 2019-03-23 DIAGNOSIS — K5669 Other partial intestinal obstruction: Secondary | ICD-10-CM | POA: Diagnosis not present

## 2019-03-23 DIAGNOSIS — Z03818 Encounter for observation for suspected exposure to other biological agents ruled out: Secondary | ICD-10-CM | POA: Diagnosis not present

## 2019-03-23 DIAGNOSIS — R918 Other nonspecific abnormal finding of lung field: Secondary | ICD-10-CM | POA: Diagnosis present

## 2019-03-23 DIAGNOSIS — E11649 Type 2 diabetes mellitus with hypoglycemia without coma: Secondary | ICD-10-CM | POA: Diagnosis not present

## 2019-03-23 DIAGNOSIS — Z8249 Family history of ischemic heart disease and other diseases of the circulatory system: Secondary | ICD-10-CM

## 2019-03-23 DIAGNOSIS — E1165 Type 2 diabetes mellitus with hyperglycemia: Secondary | ICD-10-CM | POA: Diagnosis present

## 2019-03-23 DIAGNOSIS — Z6821 Body mass index (BMI) 21.0-21.9, adult: Secondary | ICD-10-CM

## 2019-03-23 LAB — URINALYSIS, ROUTINE W REFLEX MICROSCOPIC
Bilirubin Urine: NEGATIVE
Glucose, UA: 150 mg/dL — AB
Hgb urine dipstick: NEGATIVE
Ketones, ur: 5 mg/dL — AB
Leukocytes,Ua: NEGATIVE
Nitrite: NEGATIVE
Protein, ur: NEGATIVE mg/dL
Specific Gravity, Urine: 1.026 (ref 1.005–1.030)
pH: 5 (ref 5.0–8.0)

## 2019-03-23 LAB — HEMOGLOBIN A1C
Hgb A1c MFr Bld: 6.2 % — ABNORMAL HIGH (ref 4.8–5.6)
Mean Plasma Glucose: 131.24 mg/dL

## 2019-03-23 LAB — RESPIRATORY PANEL BY RT PCR (FLU A&B, COVID)
Influenza A by PCR: NEGATIVE
Influenza B by PCR: NEGATIVE
SARS Coronavirus 2 by RT PCR: NEGATIVE

## 2019-03-23 LAB — CBC
HCT: 39.7 % (ref 39.0–52.0)
Hemoglobin: 13.4 g/dL (ref 13.0–17.0)
MCH: 30.8 pg (ref 26.0–34.0)
MCHC: 33.8 g/dL (ref 30.0–36.0)
MCV: 91.3 fL (ref 80.0–100.0)
Platelets: 300 10*3/uL (ref 150–400)
RBC: 4.35 MIL/uL (ref 4.22–5.81)
RDW: 14 % (ref 11.5–15.5)
WBC: 8.8 10*3/uL (ref 4.0–10.5)
nRBC: 0 % (ref 0.0–0.2)

## 2019-03-23 LAB — COMPREHENSIVE METABOLIC PANEL
ALT: 14 U/L (ref 0–44)
AST: 17 U/L (ref 15–41)
Albumin: 3.8 g/dL (ref 3.5–5.0)
Alkaline Phosphatase: 55 U/L (ref 38–126)
Anion gap: 11 (ref 5–15)
BUN: 29 mg/dL — ABNORMAL HIGH (ref 8–23)
CO2: 23 mmol/L (ref 22–32)
Calcium: 8.7 mg/dL — ABNORMAL LOW (ref 8.9–10.3)
Chloride: 100 mmol/L (ref 98–111)
Creatinine, Ser: 1.11 mg/dL (ref 0.61–1.24)
GFR calc Af Amer: >60 mL/min (ref >60)
GFR calc non Af Amer: >60 mL/min (ref >60)
Glucose, Bld: 263 mg/dL — ABNORMAL HIGH (ref 70–99)
Potassium: 4 mmol/L (ref 3.5–5.1)
Sodium: 134 mmol/L — ABNORMAL LOW (ref 135–145)
Total Bilirubin: 1.6 mg/dL — ABNORMAL HIGH (ref 0.3–1.2)
Total Protein: 6.9 g/dL (ref 6.5–8.1)

## 2019-03-23 LAB — LIPASE, BLOOD: Lipase: 20 U/L (ref 11–51)

## 2019-03-23 LAB — C-REACTIVE PROTEIN: CRP: 1.5 mg/dL — ABNORMAL HIGH (ref <1.0)

## 2019-03-23 LAB — GLUCOSE, CAPILLARY: Glucose-Capillary: 173 mg/dL — ABNORMAL HIGH (ref 70–99)

## 2019-03-23 MED ORDER — SODIUM CHLORIDE 0.9% FLUSH
3.0000 mL | Freq: Once | INTRAVENOUS | Status: AC
  Administered 2019-03-23: 3 mL via INTRAVENOUS

## 2019-03-23 MED ORDER — HYDROMORPHONE HCL 1 MG/ML IJ SOLN
0.5000 mg | Freq: Once | INTRAMUSCULAR | Status: AC
  Administered 2019-03-23: 0.5 mg via INTRAVENOUS
  Filled 2019-03-23: qty 1

## 2019-03-23 MED ORDER — HYDRALAZINE HCL 20 MG/ML IJ SOLN: 5.0000 mg | Freq: Four times a day (QID) | INTRAMUSCULAR | Status: DC | PRN

## 2019-03-23 MED ORDER — ONDANSETRON 4 MG PO TBDP
4.0000 mg | ORAL_TABLET | Freq: Once | ORAL | Status: AC
  Administered 2019-03-23: 4 mg via ORAL
  Filled 2019-03-23: qty 1

## 2019-03-23 MED ORDER — INSULIN ASPART 100 UNIT/ML ~~LOC~~ SOLN
0.0000 [IU] | Freq: Three times a day (TID) | SUBCUTANEOUS | Status: DC
  Administered 2019-03-28: 1 [IU] via SUBCUTANEOUS
  Administered 2019-03-29: 2 [IU] via SUBCUTANEOUS
  Administered 2019-03-29: 4 [IU] via SUBCUTANEOUS
  Filled 2019-03-23: qty 0.06

## 2019-03-23 MED ORDER — SODIUM CHLORIDE 0.9 % IV SOLN: INTRAVENOUS | Status: DC

## 2019-03-23 MED ORDER — IOHEXOL 300 MG/ML  SOLN
100.0000 mL | Freq: Once | INTRAMUSCULAR | Status: AC | PRN
  Administered 2019-03-23: 100 mL via INTRAVENOUS

## 2019-03-23 MED ORDER — SODIUM CHLORIDE 0.9 % IV BOLUS (SEPSIS)
1000.0000 mL | Freq: Once | INTRAVENOUS | Status: AC
  Administered 2019-03-23: 1000 mL via INTRAVENOUS

## 2019-03-23 MED ORDER — SODIUM CHLORIDE (PF) 0.9 % IJ SOLN
INTRAMUSCULAR | Status: AC
  Filled 2019-03-23: qty 50

## 2019-03-23 MED ORDER — TRAMADOL HCL 50 MG PO TABS
50.0000 mg | ORAL_TABLET | Freq: Four times a day (QID) | ORAL | Status: DC | PRN
  Administered 2019-03-23 – 2019-03-24 (×2): 50 mg via ORAL
  Filled 2019-03-23 (×2): qty 1

## 2019-03-23 MED ORDER — ENOXAPARIN SODIUM 40 MG/0.4ML ~~LOC~~ SOLN
40.0000 mg | SUBCUTANEOUS | Status: DC
  Administered 2019-03-24 – 2019-03-25 (×2): 40 mg via SUBCUTANEOUS
  Filled 2019-03-23 (×3): qty 0.4

## 2019-03-23 MED ORDER — ONDANSETRON HCL 4 MG/2ML IJ SOLN
4.0000 mg | Freq: Four times a day (QID) | INTRAMUSCULAR | Status: DC | PRN
  Administered 2019-03-26: 4 mg via INTRAVENOUS
  Filled 2019-03-23: qty 2

## 2019-03-23 NOTE — Consult Note (Signed)
[facility] Surgery Consult Note  Eric Moody [birth year]  [id number].    Requesting MD: Milton Ferguson Chief Complaint/Reason for Consult: SBO  HPI:  Eric Moody is a 69yo male PMH Crohns currently on Humira and azathioprine, HTN, DM, and GERD who presented to [facility] earlier today at the advice of his gastroenterologist with intermittent abdominal pain. States that over the last 12-14 days he has had intermittent increased abdominal pain and distension. States that he feels full quickly. Pain is typically worst in his RLQ. He will get bloated then eventually be able to have a bowel movement which helps relieve his pain and distension. He has had no nausea or vomiting since this began, and he has been tolerating small meals. Last BM was this morning. He has not passed any gas today which is unusual for him.  Last colonoscopy 11/2018 showed multiple tubular adenomas as well as severe stenosis and ulceration at terminal ileum/ unable to advance the scope.  CT enterography shows new moderate diffuse small bowel distension consistent with distal partial small bowel obstruction; previously demonstrated long segment wall thickening of the terminal ileum has improved compared with the most recent CT of 13  months ago, and the obstruction may be related to a developing stricture; no evidence of bowel perforation, abscess or fistula. Patient was sent to the ED, general surgery asked to see.  Abdominal surgical history: none Anticoagulants: none Nonsmoker Lives home alone Employment: retired  Review of Systems  Constitutional: Negative.   HENT: Negative.   Eyes: Negative.   Respiratory: Negative.   Cardiovascular: Negative.   Gastrointestinal: Positive for abdominal pain and constipation. Negative for nausea and vomiting.  Genitourinary: Negative.   Musculoskeletal: Negative.   Skin: Negative.   Neurological: Negative.    All systems reviewed and otherwise negative except for as  above  Family History  Problem Relation Age of Onset  . Cancer Mother   . Dementia Mother   . Heart failure Father     Past Medical History:  Diagnosis Date  . Anemia    "iron deficiency"  . Diabetes mellitus without complication ([place])   . GERD (gastroesophageal reflux disease)   . H/O seasonal allergies   . H/O syncope    x1 episode 04-28-14-  "found to have low iron" now on B12 injections weekly and oral iron.  . Hypertension   . Nonspecific pain in the neck region    "? due to recent syncope-Head hit pavement hard, [facility] "still lot of soreness neck and shoulder area."    Past Surgical History:  Procedure Laterality Date  . BACK SURGERY     disc surgery '89  . COLONOSCOPY W/ POLYPECTOMY     x2 1 polyp removed  . COLONOSCOPY WITH PROPOFOL N/A 05/07/2014   Procedure: COLONOSCOPY WITH PROPOFOL;  Surgeon: Garlan Fair, MD;  Location: [facility] ENDOSCOPY;  Service: Endoscopy;  Laterality: N/A;  . ESOPHAGOGASTRODUODENOSCOPY (EGD) WITH PROPOFOL N/A 05/07/2014   Procedure: ESOPHAGOGASTRODUODENOSCOPY (EGD) WITH PROPOFOL;  Surgeon: Garlan Fair, MD;  Location: [facility] ENDOSCOPY;  Service: Endoscopy;  Laterality: N/A;  . TONSILLECTOMY      Social History:  reports that he has never smoked. He has never used smokeless tobacco. He reports current alcohol use. He reports that he does not use drugs.  Allergies:  Allergies  Allergen Reactions  . Penicillins Other (See Comments)    Did it involve swelling of the face/tongue/throat, SOB, or low BP? Unknown Did it involve sudden  or severe rash/hives, skin peeling, or any reaction on the inside of your mouth or nose? Unknown Did you need to seek medical attention at a hospital or doctor's office? Unknown When did it last happen?Childhood If all above answers are "NO", may proceed with cephalosporin use.  . Sulfa Antibiotics     Childhood    (Not in a hospital admission)   Prior to Admission medications    Medication Sig Start Date End Date Taking? Authorizing Provider  azaTHIOprine (IMURAN) 50 MG tablet Take 150 mg by mouth daily. 03/06/19  Yes [provider]  fexofenadine (ALLEGRA) 180 MG tablet Take 180 mg by mouth daily.   Yes [provider]  fluticasone (FLONASE) 50 MCG/ACT nasal spray Place 1 spray into both nostrils daily as needed for allergies or rhinitis.   Yes [provider]  glimepiride (AMARYL) 2 MG tablet Take 2 mg by mouth daily with breakfast.   Yes [provider]  guaiFENesin (MUCINEX) 600 MG 12 hr tablet Take 600 mg by mouth 2 (two) times daily as needed for cough.   Yes [provider]  lisinopril (PRINIVIL,ZESTRIL) 10 MG tablet Take 10 mg by mouth daily.   Yes [provider]  pantoprazole (PROTONIX) 40 MG tablet Take 40 mg by mouth daily. 03/06/19  Yes [provider]    Blood pressure 139/89, pulse 87, temperature 97.8 F (36.6 C), temperature source Oral, resp. rate 16, height 5' 9"  (1.753 m), weight 66.7 kg, SpO2 95 %. Physical Exam: General: pleasant, WD/WN whie male who is laying in bed in NAD HEENT: head is normocephalic, atraumatic.  Sclera are noninjected.  Pupils equal and round.  Ears and nose without any masses or lesions.  Mouth is pink and moist. Dentition fair Heart: regular, rate, and rhythm. Palpable pedal pulses bilaterally  Lungs: CTAB, no wheezes, rhonchi, or rales noted.  Respiratory effort nonlabored Abd: soft, distended, +BS, no masses, hernias, or organomegaly. Mild RLQ TTP without rebound or guarding, no peritonitis MS: no BUE/BLE edema, calves soft and nontender Skin: warm and dry with no masses, lesions, or rashes Psych: A&Ox4 with an appropriate affect Neuro: cranial nerves grossly intact, equal strength in BUE/BLE bilaterally, normal speech, thought process intact  Results for orders placed or performed during the hospital encounter of 03/23/19 (from the past 48 hour(s))  Lipase,  blood     Status: None   Collection Time: 03/23/19 12:30 PM  Result Value Ref Range   Lipase 20 11 - 51 U/L    Comment: Performed at [facility], [place] [address] [place], [place] [address]  Comprehensive metabolic panel     Status: Abnormal   Collection Time: 03/23/19 12:30 PM  Result Value Ref Range   Sodium 134 (L) 135 - 145 mmol/L   Potassium 4.0 3.5 - 5.1 mmol/L   Chloride 100 98 - 111 mmol/L   CO2 23 22 - 32 mmol/L   Glucose, Bld 263 (H) 70 - 99 mg/dL    Comment: Glucose reference range applies only to samples taken after fasting for at least 8 hours.   BUN 29 (H) 8 - 23 mg/dL   Creatinine, Ser 1.11 0.61 - 1.24 mg/dL   Calcium 8.7 (L) 8.9 - 10.3 mg/dL   Total Protein 6.9 6.5 - 8.1 g/dL   Albumin 3.8 3.5 - 5.0 g/dL   AST 17 15 - 41 U/L   ALT 14 0 - 44 U/L   Alkaline Phosphatase 55 38 - 126 U/L  Total Bilirubin 1.6 (H) 0.3 - 1.2 mg/dL   GFR calc non Af Amer >60 >60 mL/min   GFR calc Af Amer >60 >60 mL/min   Anion gap 11 5 - 15    Comment: Performed at [facility], [place] [address] [place], [place] [address]  CBC     Status: None   Collection Time: 03/23/19 12:30 PM  Result Value Ref Range   WBC 8.8 4.0 - 10.5 K/uL   RBC 4.35 4.22 - 5.81 MIL/uL   Hemoglobin 13.4 13.0 - 17.0 g/dL   HCT 39.7 39.0 - 52.0 %   MCV 91.3 80.0 - 100.0 fL   MCH 30.8 26.0 - 34.0 pg   MCHC 33.8 30.0 - 36.0 g/dL   RDW 14.0 11.5 - 15.5 %   Platelets 300 150 - 400 K/uL   nRBC 0.0 0.0 - 0.2 %    Comment: Performed at [facility], [place] [address], [place], [place] [address]  Urinalysis, Routine w reflex microscopic     Status: Abnormal   Collection Time: 03/23/19  3:13 PM  Result Value Ref Range   Color, Urine YELLOW YELLOW   APPearance CLEAR CLEAR   Specific Gravity, Urine 1.026 1.005 - 1.030   pH 5.0 5.0 - 8.0   Glucose, UA 150 (A) NEGATIVE mg/dL   Hgb urine dipstick NEGATIVE NEGATIVE   Bilirubin Urine NEGATIVE NEGATIVE   Ketones, ur 5  (A) NEGATIVE mg/dL   Protein, ur NEGATIVE NEGATIVE mg/dL   Nitrite NEGATIVE NEGATIVE   Leukocytes,Ua NEGATIVE NEGATIVE    Comment: Performed at [place] [address], [place], [place] [address]   CT ENTERO ABD/PELVIS W CONTAST  Result Date: 03/23/2019 CLINICAL DATA:  Left lower abdominal pain for 12 days. History of Crohn's ileitis/inflammatory bowel disease. Small-bowel obstruction. EXAM: CT ABDOMEN AND PELVIS WITH CONTRAST (ENTEROGRAPHY) TECHNIQUE: Multidetector CT of the abdomen and pelvis during bolus administration of intravenous contrast. Negative oral contrast was given. CONTRAST:  14m OMNIPAQUE IOHEXOL 300 MG/ML  SOLN COMPARISON:  Abdominopelvic CT 02/09/2018 and 04/17/2014. Upper GI series 02/16/2019. FINDINGS: Lower chest: There are small subpleural nodules at both lung bases including a 5 mm nodule in the right lower lobe on image 21/6 which is unchanged. There are other nodules which were not previously imaged, including a 4 mm right lower lobe nodule on image 3/6, and a 6 mm left lower lobe nodule on image 6/6. No significant pleural or pericardial effusion. Hepatobiliary: The liver is normal in density without suspicious focal abnormality. No evidence of gallstones, gallbladder wall thickening or biliary dilatation. Pancreas: Unremarkable. No pancreatic ductal dilatation or surrounding inflammatory changes. Spleen: Normal in size without focal abnormality. Adrenals/Urinary Tract: Both adrenal glands appear normal. Single nonobstructing calculus in the interpolar region of the right kidney, best seen on coronal image 115/7. No evidence of ureteral calculus or hydronephrosis. There are small left renal cysts, largest in the lower pole. The bladder appears normal. Stomach/Bowel: The stomach is fluid-filled and mildly distended. Several duodenal diverticula are again noted. There is moderate diffuse small bowel distension which is new compared with the prior CT. A long  segment of wall thickening involving the terminal ileum is again noted, similar in distribution to the previous studies. The degree of wall thickening in these areas has improved compared with the most recent CT of 13 months ago. The colon appears decompressed without wall thickening. The appendix appears normal. No evidence of bowel perforation. Vascular/Lymphatic: There are no  enlarged abdominal or pelvic lymph nodes. Minimal aortic and branch vessel atherosclerosis. The portal, superior mesenteric and splenic veins are patent. Reproductive: The prostate gland and seminal vesicles appear normal. Other: No evidence of abdominal wall mass or hernia. No ascites. Musculoskeletal: No acute or significant osseous findings. IMPRESSION: 1. New moderate diffuse small bowel distension consistent with distal partial small bowel obstruction. Previously demonstrated long segment wall thickening of the terminal ileum has improved compared with the most recent CT of 13 months ago, and the obstruction may be related to a developing stricture. 2. No evidence of bowel perforation, abscess or fistula. 3. Nonobstructing right renal calculus. 4. Small subpleural nodules at both lung bases, some of which were not previously imaged. These are of doubtful significance, and no dedicated follow-up is required if this patient is low risk for bronchogenic carcinoma (and has no known or suspected primary neoplasm). Non-contrast chest CT can be considered in 12 months if patient is high-risk. This recommendation follows the consensus statement: Guidelines for Management of Incidental Pulmonary Nodules Detected on CT Images: From the Fleischner Society 2017; Radiology 2017; 284:228-243. Electronically Signed   By: Richardean Sale M.D.   On: 03/23/2019 15:56   DG Abd 2 Views  Result Date: 03/23/2019 CLINICAL DATA:  Abdominal pain. Abnormal upper GI and small-bowel follow-through. EXAM: ABDOMEN - 2 VIEW COMPARISON:  Upper GI small-bowel  follow-through 02/16/2019. CT abdomen and pelvis 02/09/2018. FINDINGS: Multiple prominently distended loops of small bowel noted. Relative paucity of colonic air. Findings most consistent with small bowel obstruction. No free air is identified. Residual barium noted over the left lower pelvis. No acute bony abnormality identified. IMPRESSION: Multiple prominently distended loops of small bowel noted most consistent with small bowel obstruction. Small-bowel obstruction may be secondary to previously identified ileal disease. Reference is made to prior upper GI small-bowel report of 02/16/2019 and CT abdomen and pelvis report of 02/09/2018. Electronically Signed   By: Marcello Moores  Register   On: 03/23/2019 10:37   Anti-infectives (From admission, onward)   None        Assessment/Plan HTN DM GERD  Crohns  - currently on Humira and azathioprine - last colonoscopy 11/2018 showed multiple tubular adenomas as well as severe stenosis and ulceration at terminal ileum/ unable to advance the scope   SBO, possibly secondary to stricture - CT enterography shows new moderate diffuse small bowel distension consistent with distal partial small bowel obstruction; previously demonstrated long segment wall thickening of the terminal ileum has improved compared with the most recent CT of 13  months ago, and the obstruction may be related to a developing stricture; no evidence of bowel perforation, abscess or fistula  ID - none VTE - SCDs, lovenox FEN - IVF, NPO/NGT to LIWS Foley - none Follow up - TBD  Plan: Patient with several months of intermittent obstructive symptoms, worse over the last 2 weeks. CT enterography suggests this may be due to a developing stricture. He was noted to have severe stenosis and ulceration at the terminal ileum on last colonoscopy 11/2018.  Gastroenterology following, considering MR enterography to differentiate between fibrosing and inflammatory stricture.  At this time recommend  NG tube. May require resection but will see how he does with decompression and await further gastroenterology recommendations.  Wellington Hampshire, [facility] 03/23/2019, 4:49 PM Please see Amion for pager number during day hours 7:00am-4:30pm

## 2019-03-23 NOTE — ED Triage Notes (Signed)
Patient c/o left lower abdominal pain x 12 days. Patient states, "I have an obstruction." Patient denies any N/v.

## 2019-03-23 NOTE — H&P (Signed)
History and Physical    Eric Moody MLJ:[id number] DOB: Oct 20, 1950 DOA: 03/23/2019  PCP: Lavone Orn, MD Patient coming from: Home  Chief Complaint: Abdominal pain for the last few days HPI: Eric Moody is a 69 y.o. male with medical history significant of Crohn's ileitis with history of terminal ileal stricture and ulceration admitted with abdominal pain worsening.  He had KUB as an outpatient which showed small bowel obstruction.  He was told to come to the ER by GI.  He denied nausea or vomiting.  He had a bowel movement on the day of admission he reports is a very small hard BM.  He denies any hematuria hematochezia or melena. He denies any fever chills chest pain shortness of breath or cough. He denies any urinary complaints.    ED Course: Patient had a CT of the abdomen in the ER-New moderate diffuse small bowel distension consistent with distal partial small bowel obstruction. Previously demonstrated long segment wall thickening of the terminal ileum has improved compared with the most recent CT of 13 months ago, and the obstruction may be related to a developing stricture. 2. No evidence of bowel perforation, abscess or fistula. 3. Nonobstructing right renal calculus. 4. Small subpleural nodules at both lung bases, some of which were not previously imaged. These are of doubtful significance, and no dedicated follow-up is required if this patient is low risk for bronchogenic carcinoma (and has no known or suspected primary neoplasm). Non-contrast chest CT can be considered in 12 months if patient is high-risk  Review of Systems: As per HPI otherwise all other systems reviewed and are negative  Ambulatory Status: He is ambulatory at baseline Past Medical History:  Diagnosis Date  . Anemia    "iron deficiency"  . Diabetes mellitus without complication ([place])   . GERD (gastroesophageal reflux disease)   . H/O seasonal allergies   . H/O syncope    x1 episode 04-28-14-  "found  to have low iron" now on B12 injections weekly and oral iron.  . Hypertension   . Nonspecific pain in the neck region    "? due to recent syncope-Head hit pavement hard, was evaluated in ERD [facility]." "still lot of soreness neck and shoulder area."    Past Surgical History:  Procedure Laterality Date  . BACK SURGERY     disc surgery '89  . COLONOSCOPY W/ POLYPECTOMY     x2 1 polyp removed  . COLONOSCOPY WITH PROPOFOL N/A 05/07/2014   Procedure: COLONOSCOPY WITH PROPOFOL;  Surgeon: Garlan Fair, MD;  Location: [facility];  Service: Endoscopy;  Laterality: N/A;  . ESOPHAGOGASTRODUODENOSCOPY (EGD) WITH PROPOFOL N/A 05/07/2014   Procedure: ESOPHAGOGASTRODUODENOSCOPY (EGD) WITH PROPOFOL;  Surgeon: Garlan Fair, MD;  Location: [facility];  Service: Endoscopy;  Laterality: N/A;  . TONSILLECTOMY      Social History   Socioeconomic History  . Marital status: Single    Spouse name: Not on file  . Number of children: Not on file  . Years of education: Not on file  . Highest education level: Not on file  Occupational History  . Not on file  Tobacco Use  . Smoking status: Never Smoker  . Smokeless tobacco: Never Used  Substance and Sexual Activity  . Alcohol use: Yes    Comment: occ. social  . Drug use: No  . Sexual activity: Not on file  Other Topics Concern  . Not on file  Social History Narrative  . Not on file   Social  Determinants of Health   Financial Resource Strain:   . Difficulty of Paying Living Expenses: Not on file  Food Insecurity:   . Worried About Charity fundraiser in the Last Year: Not on file  . Ran Out of Food in the Last Year: Not on file  Transportation Needs:   . Lack of Transportation (Medical): Not on file  . Lack of Transportation (Non-Medical): Not on file  Physical Activity:   . Days of Exercise per Week: Not on file  . Minutes of Exercise per Session: Not on file  Stress:   . Feeling of Stress : Not on file  Social Connections:   .  Frequency of Communication with Friends and Family: Not on file  . Frequency of Social Gatherings with Friends and Family: Not on file  . Attends Religious Services: Not on file  . Active Member of Clubs or Organizations: Not on file  . Attends Archivist Meetings: Not on file  . Marital Status: Not on file  Intimate Partner Violence:   . Fear of Current or Ex-Partner: Not on file  . Emotionally Abused: Not on file  . Physically Abused: Not on file  . Sexually Abused: Not on file    Allergies  Allergen Reactions  . Penicillins Other (See Comments)    Did it involve swelling of the face/tongue/throat, SOB, or low BP? Unknown Did it involve sudden or severe rash/hives, skin peeling, or any reaction on the inside of your mouth or nose? Unknown Did you need to seek medical attention at a hospital or doctor's office? Unknown When did it last happen?Childhood If all above answers are "NO", may proceed with cephalosporin use.  . Sulfa Antibiotics     Childhood    Family History  Problem Relation Age of Onset  . Cancer Mother   . Dementia Mother   . Heart failure Father     Prior to Admission medications   Medication Sig Start Date End Date Taking? Authorizing Provider  azaTHIOprine (IMURAN) 50 MG tablet Take 150 mg by mouth daily. 03/06/19  Yes [provider]  fexofenadine (ALLEGRA) 180 MG tablet Take 180 mg by mouth daily.   Yes [provider]  fluticasone (FLONASE) 50 MCG/ACT nasal spray Place 1 spray into both nostrils daily as needed for allergies or rhinitis.   Yes [provider]  glimepiride (AMARYL) 2 MG tablet Take 2 mg by mouth daily with breakfast.   Yes [provider]  guaiFENesin (MUCINEX) 600 MG 12 hr tablet Take 600 mg by mouth 2 (two) times daily as needed for cough.   Yes [provider]  lisinopril (PRINIVIL,ZESTRIL) 10 MG tablet Take 10 mg by mouth daily.   Yes [provider]  pantoprazole  (PROTONIX) 40 MG tablet Take 40 mg by mouth daily. 03/06/19  Yes [provider]    Physical Exam: Vitals:   03/23/19 1402 03/23/19 1522 03/23/19 1530 03/23/19 1630  BP:  (!) 147/95 132/80 139/89  Pulse: 86 76 68 87  Resp:  16 15 16   Temp:      TempSrc:      SpO2: 97% 97% 96% 95%  Weight:      Height:         . General:  Appears calm and comfortable . Eyes: PERRL, EOMI, normal lids, iris . ENT: grossly normal hearing, lips & tongue, mmm . Neck:no LAD, masses or thyromegaly . Cardiovascular: RRR, no m/r/g. No LE edema.  Marland Kitchen Respiratory: CTA  bilaterally, no w/r/r. Normal respiratory effort. . Abdomen: soft,, NABS distended . Skin:  no rash or induration seen on limited exam . Musculoskeletal: grossly normal tone BUE/BLE, good ROM, no bony abnormality . Psychiatric:  grossly normal mood and affect, speech fluent and appropriate, AOx3 . Neurologic: CN 2-12 grossly intact, moves all extremities in coordinated fashion, sensation intact  Labs on Admission: I have personally reviewed following labs and imaging studies  CBC: Recent Labs  Lab 03/23/19 1230  WBC 8.8  HGB 13.4  HCT 39.7  MCV 91.3  PLT 597   Basic Metabolic Panel: Recent Labs  Lab 03/23/19 1230  NA 134*  K 4.0  CL 100  CO2 23  GLUCOSE 263*  BUN 29*  CREATININE 1.11  CALCIUM 8.7*   GFR: Estimated Creatinine Clearance: 60.1 mL/min (by C-G formula based on SCr of 1.11 mg/dL). Liver Function Tests: Recent Labs  Lab 03/23/19 1230  AST 17  ALT 14  ALKPHOS 55  BILITOT 1.6*  PROT 6.9  ALBUMIN 3.8   Recent Labs  Lab 03/23/19 1230  LIPASE 20   No results for input(s): AMMONIA in the last 168 hours. Coagulation Profile: No results for input(s): INR, PROTIME in the last 168 hours. Cardiac Enzymes: No results for input(s): CKTOTAL, CKMB, CKMBINDEX, TROPONINI in the last 168 hours. BNP (last 3 results) No results for input(s): PROBNP in the last 8760 hours. HbA1C: No results for input(s):  HGBA1C in the last 72 hours. CBG: No results for input(s): GLUCAP in the last 168 hours. Lipid Profile: No results for input(s): CHOL, HDL, LDLCALC, TRIG, CHOLHDL, LDLDIRECT in the last 72 hours. Thyroid Function Tests: No results for input(s): TSH, T4TOTAL, FREET4, T3FREE, THYROIDAB in the last 72 hours. Anemia Panel: No results for input(s): VITAMINB12, FOLATE, FERRITIN, TIBC, IRON, RETICCTPCT in the last 72 hours. Urine analysis:    Component Value Date/Time   COLORURINE YELLOW 03/23/2019 1513   APPEARANCEUR CLEAR 03/23/2019 1513   LABSPEC 1.026 03/23/2019 1513   PHURINE 5.0 03/23/2019 1513   GLUCOSEU 150 (A) 03/23/2019 1513   HGBUR NEGATIVE 03/23/2019 1513   BILIRUBINUR NEGATIVE 03/23/2019 1513   KETONESUR 5 (A) 03/23/2019 1513   PROTEINUR NEGATIVE 03/23/2019 1513   NITRITE NEGATIVE 03/23/2019 1513   LEUKOCYTESUR NEGATIVE 03/23/2019 1513    Creatinine Clearance: Estimated Creatinine Clearance: 60.1 mL/min (by C-G formula based on SCr of 1.11 mg/dL).  Sepsis Labs: @LABRCNTIP (procalcitonin:4,lacticidven:4) )No results found for this or any previous visit (from the past 240 hour(s)).   Radiological Exams on Admission: CT ENTERO ABD/PELVIS W CONTAST  Result Date: 03/23/2019 CLINICAL DATA:  Left lower abdominal pain for 12 days. History of Crohn's ileitis/inflammatory bowel disease. Small-bowel obstruction. EXAM: CT ABDOMEN AND PELVIS WITH CONTRAST (ENTEROGRAPHY) TECHNIQUE: Multidetector CT of the abdomen and pelvis during bolus administration of intravenous contrast. Negative oral contrast was given. CONTRAST:  168m OMNIPAQUE IOHEXOL 300 MG/ML  SOLN COMPARISON:  Abdominopelvic CT 02/09/2018 and 04/17/2014. Upper GI series 02/16/2019. FINDINGS: Lower chest: There are small subpleural nodules at both lung bases including a 5 mm nodule in the right lower lobe on image 21/6 which is unchanged. There are other nodules which were not previously imaged, including a 4 mm right lower lobe  nodule on image 3/6, and a 6 mm left lower lobe nodule on image 6/6. No significant pleural or pericardial effusion. Hepatobiliary: The liver is normal in density without suspicious focal abnormality. No evidence of gallstones, gallbladder wall thickening or biliary dilatation. Pancreas: Unremarkable. No pancreatic ductal dilatation  or surrounding inflammatory changes. Spleen: Normal in size without focal abnormality. Adrenals/Urinary Tract: Both adrenal glands appear normal. Single nonobstructing calculus in the interpolar region of the right kidney, best seen on coronal image 115/7. No evidence of ureteral calculus or hydronephrosis. There are small left renal cysts, largest in the lower pole. The bladder appears normal. Stomach/Bowel: The stomach is fluid-filled and mildly distended. Several duodenal diverticula are again noted. There is moderate diffuse small bowel distension which is new compared with the prior CT. A long segment of wall thickening involving the terminal ileum is again noted, similar in distribution to the previous studies. The degree of wall thickening in these areas has improved compared with the most recent CT of 13 months ago. The colon appears decompressed without wall thickening. The appendix appears normal. No evidence of bowel perforation. Vascular/Lymphatic: There are no enlarged abdominal or pelvic lymph nodes. Minimal aortic and branch vessel atherosclerosis. The portal, superior mesenteric and splenic veins are patent. Reproductive: The prostate gland and seminal vesicles appear normal. Other: No evidence of abdominal wall mass or hernia. No ascites. Musculoskeletal: No acute or significant osseous findings. IMPRESSION: 1. New moderate diffuse small bowel distension consistent with distal partial small bowel obstruction. Previously demonstrated long segment wall thickening of the terminal ileum has improved compared with the most recent CT of 13 months ago, and the obstruction may  be related to a developing stricture. 2. No evidence of bowel perforation, abscess or fistula. 3. Nonobstructing right renal calculus. 4. Small subpleural nodules at both lung bases, some of which were not previously imaged. These are of doubtful significance, and no dedicated follow-up is required if this patient is low risk for bronchogenic carcinoma (and has no known or suspected primary neoplasm). Non-contrast chest CT can be considered in 12 months if patient is high-risk. This recommendation follows the consensus statement: Guidelines for Management of Incidental Pulmonary Nodules Detected on CT Images: From the Fleischner Society 2017; Radiology 2017; 284:228-243. Electronically Signed   By: Richardean Sale M.D.   On: 03/23/2019 15:56   DG Abd 2 Views  Result Date: 03/23/2019 CLINICAL DATA:  Abdominal pain. Abnormal upper GI and small-bowel follow-through. EXAM: ABDOMEN - 2 VIEW COMPARISON:  Upper GI small-bowel follow-through 02/16/2019. CT abdomen and pelvis 02/09/2018. FINDINGS: Multiple prominently distended loops of small bowel noted. Relative paucity of colonic air. Findings most consistent with small bowel obstruction. No free air is identified. Residual barium noted over the left lower pelvis. No acute bony abnormality identified. IMPRESSION: Multiple prominently distended loops of small bowel noted most consistent with small bowel obstruction. Small-bowel obstruction may be secondary to previously identified ileal disease. Reference is made to prior upper GI small-bowel report of 02/16/2019 and CT abdomen and pelvis report of 02/09/2018. Electronically Signed   By: Marcello Moores  Register   On: 03/23/2019 10:37    Assessment/Plan Active Problems:   * No active hospital problems. *   #1 partial small bowel obstruction-we will keep patient n.p.o. IV fluids hold off on NG tube as he is not having any active vomiting he reports his abdomen is distended from his baseline. General surgery has been  consulted and appreciate their recommendations GI notes reviewed.  #2 type 2 diabetes with hyperglycemia patient on Amaryl at home which I will hold put him on SSI  #3 mild pseudohyponatremia secondary to hyperglycemia should improve with IV fluids follow-up in a.m.  Severity of Illness: The appropriate patient status for this patient is OBSERVATION. Observation status is judged to  be reasonable and necessary in order to provide the required intensity of service to ensure the patient's safety. The patient's presenting symptoms, physical exam findings, and initial radiographic and laboratory data in the context of their medical condition is felt to place them at decreased risk for further clinical deterioration. Furthermore, it is anticipated that the patient will be medically stable for discharge from the hospital within 2 midnights of admission. The following factors support the patient status of observation.   " The patient's presenting symptoms include abdominal distention and abdominal pain. " The physical exam findings include abdominal distention " The initial radiographic and laboratory data are small bowel obstruction    Estimated body mass index is 21.71 kg/m as calculated from the following:   Height as of this encounter: 5' 9"  (1.753 m).   Weight as of this encounter: 66.7 kg.   DVT prophylaxis: Lovenox Code Status: Full code Family Communication: None Disposition Plan: Pending improvement in bowel obstruction Consults called: GI and general surgery Admission status: Observation   Georgette Shell MD [facility]  If 7PM-7AM, please contact night-coverage [email or web address] Password [facility]  03/23/2019, 4:57 PM

## 2019-03-23 NOTE — ED Notes (Signed)
Pt transported to CT ?

## 2019-03-23 NOTE — ED Provider Notes (Signed)
[facility] DEPT Provider Note   CSN: [id number] Arrival date & time: 03/23/19  1150     History Chief Complaint  Patient presents with  . Abdominal Pain    Eric Moody is a 69 y.o. male.  Patient complains of nausea and abdominal pain.  Patient has no history of Crohn's disease.  Patient's gastroenterologist wanted the patient to get a CT scan of his abdomen  The history is provided by the patient. No language interpreter was used.  Abdominal Pain Pain location:  Generalized Pain quality: aching   Pain radiates to:  Does not radiate Pain severity:  Mild Onset quality:  Sudden Timing:  Constant Progression:  Worsening Chronicity:  Recurrent Context: not alcohol use   Associated symptoms: no chest pain, no cough, no diarrhea, no fatigue and no hematuria        Past Medical History:  Diagnosis Date  . Anemia    "iron deficiency"  . Diabetes mellitus without complication ([place])   . GERD (gastroesophageal reflux disease)   . H/O seasonal allergies   . H/O syncope    x1 episode 04-28-14-  "found to have low iron" now on B12 injections weekly and oral iron.  . Hypertension   . Nonspecific pain in the neck region    "? due to recent syncope-Head hit pavement hard, was evaluated in ERD [facility]." "still lot of soreness neck and shoulder area."    There are no problems to display for this patient.   Past Surgical History:  Procedure Laterality Date  . BACK SURGERY     disc surgery '89  . COLONOSCOPY W/ POLYPECTOMY     x2 1 polyp removed  . COLONOSCOPY WITH PROPOFOL N/A 05/07/2014   Procedure: COLONOSCOPY WITH PROPOFOL;  Surgeon: Garlan Fair, MD;  Location: [facility];  Service: Endoscopy;  Laterality: N/A;  . ESOPHAGOGASTRODUODENOSCOPY (EGD) WITH PROPOFOL N/A 05/07/2014   Procedure: ESOPHAGOGASTRODUODENOSCOPY (EGD) WITH PROPOFOL;  Surgeon: Garlan Fair, MD;  Location: [facility];  Service: Endoscopy;  Laterality: N/A;  .  TONSILLECTOMY         Family History  Problem Relation Age of Onset  . Cancer Mother   . Dementia Mother   . Heart failure Father     Social History   Tobacco Use  . Smoking status: Never Smoker  . Smokeless tobacco: Never Used  Substance Use Topics  . Alcohol use: Yes    Comment: occ. social  . Drug use: No    Home Medications Prior to Admission medications   Medication Sig Start Date End Date Taking? Authorizing Provider  azaTHIOprine (IMURAN) 50 MG tablet Take 150 mg by mouth daily. 03/06/19  Yes [provider]  fexofenadine (ALLEGRA) 180 MG tablet Take 180 mg by mouth daily.   Yes [provider]  fluticasone (FLONASE) 50 MCG/ACT nasal spray Place 1 spray into both nostrils daily as needed for allergies or rhinitis.   Yes [provider]  glimepiride (AMARYL) 2 MG tablet Take 2 mg by mouth daily with breakfast.   Yes [provider]  guaiFENesin (MUCINEX) 600 MG 12 hr tablet Take 600 mg by mouth 2 (two) times daily as needed for cough.   Yes [provider]  lisinopril (PRINIVIL,ZESTRIL) 10 MG tablet Take 10 mg by mouth daily.   Yes [provider]  pantoprazole (PROTONIX) 40 MG tablet Take 40 mg by mouth daily. 03/06/19  Yes [provider]    Allergies    Penicillins  and Sulfa antibiotics  Review of Systems   Review of Systems  Constitutional: Negative for appetite change and fatigue.  HENT: Negative for congestion, ear discharge and sinus pressure.   Eyes: Negative for discharge.  Respiratory: Negative for cough.   Cardiovascular: Negative for chest pain.  Gastrointestinal: Positive for abdominal pain. Negative for diarrhea.  Genitourinary: Negative for frequency and hematuria.  Musculoskeletal: Negative for back pain.  Skin: Negative for rash.  Neurological: Negative for seizures and headaches.  Psychiatric/Behavioral: Negative for hallucinations.    Physical Exam Updated Vital Signs BP 139/89    Pulse 87   Temp 97.8 F (36.6 C) (Oral)   Resp 16   Ht 5' 9"  (1.753 m)   Wt 66.7 kg   SpO2 95%   BMI 21.71 kg/m   Physical Exam Vitals and nursing note reviewed.  Constitutional:      Appearance: He is well-developed.  HENT:     Head: Normocephalic.     Nose: Nose normal.  Eyes:     General: No scleral icterus.    Conjunctiva/sclera: Conjunctivae normal.  Neck:     Thyroid: No thyromegaly.  Cardiovascular:     Rate and Rhythm: Normal rate and regular rhythm.     Heart sounds: No murmur. No friction rub. No gallop.   Pulmonary:     Breath sounds: No stridor. No wheezing or rales.  Chest:     Chest wall: No tenderness.  Abdominal:     General: There is no distension.     Tenderness: There is abdominal tenderness. There is no rebound.  Musculoskeletal:        General: Normal range of motion.     Cervical back: Neck supple.  Lymphadenopathy:     Cervical: No cervical adenopathy.  Skin:    Findings: No erythema or rash.  Neurological:     Mental Status: He is alert and oriented to person, place, and time.     Motor: No abnormal muscle tone.     Coordination: Coordination normal.  Psychiatric:        Behavior: Behavior normal.     ED Results / Procedures / Treatments   Labs (all labs ordered are listed, but only abnormal results are displayed) Labs Reviewed  COMPREHENSIVE METABOLIC PANEL - Abnormal; Notable for the following components:      Result Value   Sodium 134 (*)    Glucose, Bld 263 (*)    BUN 29 (*)    Calcium 8.7 (*)    Total Bilirubin 1.6 (*)    All other components within normal limits  URINALYSIS, ROUTINE W REFLEX MICROSCOPIC - Abnormal; Notable for the following components:   Glucose, UA 150 (*)    Ketones, ur 5 (*)    All other components within normal limits  RESPIRATORY PANEL BY RT PCR (FLU A&B, COVID)  LIPASE, BLOOD  CBC  C-REACTIVE PROTEIN    EKG None  Radiology CT ENTERO ABD/PELVIS W CONTAST  Result Date: 03/23/2019 CLINICAL  DATA:  Left lower abdominal pain for 12 days. History of Crohn's ileitis/inflammatory bowel disease. Small-bowel obstruction. EXAM: CT ABDOMEN AND PELVIS WITH CONTRAST (ENTEROGRAPHY) TECHNIQUE: Multidetector CT of the abdomen and pelvis during bolus administration of intravenous contrast. Negative oral contrast was given. CONTRAST:  135m OMNIPAQUE IOHEXOL 300 MG/ML  SOLN COMPARISON:  Abdominopelvic CT 02/09/2018 and 04/17/2014. Upper GI series 02/16/2019. FINDINGS: Lower chest: There are small subpleural nodules at both lung bases including a 5 mm nodule in the right lower lobe on  image 21/6 which is unchanged. There are other nodules which were not previously imaged, including a 4 mm right lower lobe nodule on image 3/6, and a 6 mm left lower lobe nodule on image 6/6. No significant pleural or pericardial effusion. Hepatobiliary: The liver is normal in density without suspicious focal abnormality. No evidence of gallstones, gallbladder wall thickening or biliary dilatation. Pancreas: Unremarkable. No pancreatic ductal dilatation or surrounding inflammatory changes. Spleen: Normal in size without focal abnormality. Adrenals/Urinary Tract: Both adrenal glands appear normal. Single nonobstructing calculus in the interpolar region of the right kidney, best seen on coronal image 115/7. No evidence of ureteral calculus or hydronephrosis. There are small left renal cysts, largest in the lower pole. The bladder appears normal. Stomach/Bowel: The stomach is fluid-filled and mildly distended. Several duodenal diverticula are again noted. There is moderate diffuse small bowel distension which is new compared with the prior CT. A long segment of wall thickening involving the terminal ileum is again noted, similar in distribution to the previous studies. The degree of wall thickening in these areas has improved compared with the most recent CT of 13 months ago. The colon appears decompressed without wall thickening. The  appendix appears normal. No evidence of bowel perforation. Vascular/Lymphatic: There are no enlarged abdominal or pelvic lymph nodes. Minimal aortic and branch vessel atherosclerosis. The portal, superior mesenteric and splenic veins are patent. Reproductive: The prostate gland and seminal vesicles appear normal. Other: No evidence of abdominal wall mass or hernia. No ascites. Musculoskeletal: No acute or significant osseous findings. IMPRESSION: 1. New moderate diffuse small bowel distension consistent with distal partial small bowel obstruction. Previously demonstrated long segment wall thickening of the terminal ileum has improved compared with the most recent CT of 13 months ago, and the obstruction may be related to a developing stricture. 2. No evidence of bowel perforation, abscess or fistula. 3. Nonobstructing right renal calculus. 4. Small subpleural nodules at both lung bases, some of which were not previously imaged. These are of doubtful significance, and no dedicated follow-up is required if this patient is low risk for bronchogenic carcinoma (and has no known or suspected primary neoplasm). Non-contrast chest CT can be considered in 12 months if patient is high-risk. This recommendation follows the consensus statement: Guidelines for Management of Incidental Pulmonary Nodules Detected on CT Images: From the Fleischner Society 2017; Radiology 2017; 284:228-243. Electronically Signed   By: Richardean Sale M.D.   On: 03/23/2019 15:56   DG Abd 2 Views  Result Date: 03/23/2019 CLINICAL DATA:  Abdominal pain. Abnormal upper GI and small-bowel follow-through. EXAM: ABDOMEN - 2 VIEW COMPARISON:  Upper GI small-bowel follow-through 02/16/2019. CT abdomen and pelvis 02/09/2018. FINDINGS: Multiple prominently distended loops of small bowel noted. Relative paucity of colonic air. Findings most consistent with small bowel obstruction. No free air is identified. Residual barium noted over the left lower pelvis.  No acute bony abnormality identified. IMPRESSION: Multiple prominently distended loops of small bowel noted most consistent with small bowel obstruction. Small-bowel obstruction may be secondary to previously identified ileal disease. Reference is made to prior upper GI small-bowel report of 02/16/2019 and CT abdomen and pelvis report of 02/09/2018. Electronically Signed   By: Marcello Moores  Register   On: 03/23/2019 10:37    Procedures Procedures (including critical care time)  Medications Ordered in ED Medications  sodium chloride (PF) 0.9 % injection (has no administration in time range)  sodium chloride flush (NS) 0.9 % injection 3 mL (3 mLs Intravenous Given 03/23/19 1319)  sodium chloride 0.9 % bolus 1,000 mL (0 mLs Intravenous Stopped 03/23/19 1523)  ondansetron (ZOFRAN-ODT) disintegrating tablet 4 mg (4 mg Oral Given 03/23/19 1313)  HYDROmorphone (DILAUDID) injection 0.5 mg (0.5 mg Intravenous Given 03/23/19 1321)  iohexol (OMNIPAQUE) 300 MG/ML solution 100 mL (100 mLs Intravenous Contrast Given 03/23/19 1508)    ED Course  I have reviewed the triage vital signs and the nursing notes.  Pertinent labs & imaging results that were available during my care of the patient were reviewed by me and considered in my medical decision making (see chart for details).    MDM Rules/Calculators/A&P                      CT scan shows small bowel obstruction.  There is the patient will be admitted to medicine with surgical consult and GI from above are all the patient in the hospital tomorrow Final Clinical Impression(s) / ED Diagnoses Final diagnoses:  SBO (small bowel obstruction) ([place])    Rx / DC Orders ED Discharge Orders    None       Milton Ferguson, MD 03/23/19 1643

## 2019-03-23 NOTE — Consult Note (Signed)
Primary Care Physician:  Lavone Orn, MD Primary Gastroenterologist:  Dr. Alessandra Bevels  Reason for admission  :  small bowel obstruction in patient with Crohn's ileitis  HPI: Eric Moody is a 69 y.o. male with past medical history of Crohn's ileitis with history of terminal ileal stricture and ulceration was advised to come to the hospital for further management of small bowel obstruction.  Patient is currently on Humira and Imuran.  He was diagnosed with Crohn's ileitis in 2016.  Was initially started on Remicade but was subsequently switched to Humira because of insurance issues.  Patient having intermittent flareup of his Crohn's disease since last 1 year.  He was started on prednisone taper several times and was also placed on azathioprine in September.  Azathioprine was increased to 150 mg/day in January 2021.  He called our office yesterday because of worsening abdominal pain.  Outpatient abdominal x-ray was performed which showed small bowel obstruction.  He denies any associated nausea or vomiting.  Complaining of diarrhea alternating with constipation.  Denies any blood in the stool or black stool.   He underwent colonoscopy in November 2020 which showed multiple tubular adenomas as well as severe stenosis and ulceration at terminal ileum and I was not able to advance the scope. Repeat colonoscopy recommended in 3 years.  No family history of colon cancer. Daughter is also diagnosed with Crohn's diseas   Past Medical History:  Diagnosis Date  . Anemia    "iron deficiency"  . Diabetes mellitus without complication ([place])   . GERD (gastroesophageal reflux disease)   . H/O seasonal allergies   . H/O syncope    x1 episode 04-28-14-  "found to have low iron" now on B12 injections weekly and oral iron.  . Hypertension   . Nonspecific pain in the neck region    "? due to recent syncope-Head hit pavement hard, was evaluated in [facility] "still lot of soreness neck and shoulder area."     Past Surgical History:  Procedure Laterality Date  . BACK SURGERY     disc surgery '89  . COLONOSCOPY W/ POLYPECTOMY     x2 1 polyp removed  . COLONOSCOPY WITH PROPOFOL N/A 05/07/2014   Procedure: COLONOSCOPY WITH PROPOFOL;  Surgeon: Garlan Fair, MD;  Location: [facility];  Service: Endoscopy;  Laterality: N/A;  . ESOPHAGOGASTRODUODENOSCOPY (EGD) WITH PROPOFOL N/A 05/07/2014   Procedure: ESOPHAGOGASTRODUODENOSCOPY (EGD) WITH PROPOFOL;  Surgeon: Garlan Fair, MD;  Location: [facility];  Service: Endoscopy;  Laterality: N/A;  . TONSILLECTOMY      Prior to Admission medications   Medication Sig Start Date End Date Taking? Authorizing Provider  cyanocobalamin (,VITAMIN B-12,) 1000 MCG/ML injection Inject 1,000 mcg into the muscle every 28 (twenty-eight) days.    [provider]  ferrous fumarate (HEMOCYTE - 106 MG FE) 325 (106 FE) MG TABS tablet Take 1 tablet by mouth 2 (two) times daily.    [provider]  fluticasone (FLONASE) 50 MCG/ACT nasal spray Place 1 spray into both nostrils daily as needed for allergies or rhinitis.    [provider]  glimepiride (AMARYL) 2 MG tablet Take 2 mg by mouth daily with breakfast.    [provider]  guaiFENesin (MUCINEX) 600 MG 12 hr tablet Take 600 mg by mouth 2 (two) times daily as needed for cough.    [provider]  lisinopril (PRINIVIL,ZESTRIL) 10 MG tablet Take 10 mg by mouth daily.    [provider]  omeprazole (PRILOSEC) 20  MG capsule Take 20 mg by mouth daily.    [provider]  oxyCODONE-acetaminophen (PERCOCET/ROXICET) 5-325 MG per tablet Take 0.5 tablets by mouth every 6 (six) hours as needed for severe pain (Pain).    [provider]  predniSONE (DELTASONE) 10 MG tablet Take 40 mg by mouth daily with breakfast.    [provider]  traMADol (ULTRAM) 50 MG tablet Take 1 tablet (50 mg total) by mouth every 6 (six) hours as needed. Patient taking  differently: Take 50 mg by mouth every 6 (six) hours as needed for moderate pain.  04/28/14   Hyman Bible, PA-C    Scheduled Meds: .  HYDROmorphone (DILAUDID) injection  0.5 mg Intravenous Once  . ondansetron  4 mg Oral Once  . sodium chloride flush  3 mL Intravenous Once   Continuous Infusions: . sodium chloride     PRN Meds:.  Allergies as of 03/23/2019 - Review Complete 03/23/2019  Allergen Reaction Noted  . Penicillins  04/28/2014  . Sulfa antibiotics  04/28/2014    Family History  Problem Relation Age of Onset  . Cancer Mother   . Dementia Mother   . Heart failure Father     Social History   Socioeconomic History  . Marital status: Single    Spouse name: Not on file  . Number of children: Not on file  . Years of education: Not on file  . Highest education level: Not on file  Occupational History  . Not on file  Tobacco Use  . Smoking status: Never Smoker  . Smokeless tobacco: Never Used  Substance and Sexual Activity  . Alcohol use: Yes    Comment: occ. social  . Drug use: No  . Sexual activity: Not on file  Other Topics Concern  . Not on file  Social History Narrative  . Not on file   Social Determinants of Health   Financial Resource Strain:   . Difficulty of Paying Living Expenses: Not on file  Food Insecurity:   . Worried About Charity fundraiser in the Last Year: Not on file  . Ran Out of Food in the Last Year: Not on file  Transportation Needs:   . Lack of Transportation (Medical): Not on file  . Lack of Transportation (Non-Medical): Not on file  Physical Activity:   . Days of Exercise per Week: Not on file  . Minutes of Exercise per Session: Not on file  Stress:   . Feeling of Stress : Not on file  Social Connections:   . Frequency of Communication with Friends and Family: Not on file  . Frequency of Social Gatherings with Friends and Family: Not on file  . Attends Religious Services: Not on file  . Active Member of Clubs or  Organizations: Not on file  . Attends Archivist Meetings: Not on file  . Marital Status: Not on file  Intimate Partner Violence:   . Fear of Current or Ex-Partner: Not on file  . Emotionally Abused: Not on file  . Physically Abused: Not on file  . Sexually Abused: Not on file    Review of Systems: All negative except as stated above in HPI.  Physical Exam: Vital signs: Vitals:   03/23/19 1155 03/23/19 1230  BP: (!) 142/103 132/87  Pulse: (!) 122 (!) 105  Resp: 18 16  Temp: 97.8 F (36.6 C)   SpO2: 98% 97%     Physical Exam  Constitutional: He is oriented to person, place, and  time. He appears well-developed and well-nourished. No distress.  HENT:  Head: Normocephalic and atraumatic.  Mouth/Throat: No oropharyngeal exudate.  Eyes: EOM are normal. No scleral icterus.  Cardiovascular: Normal rate, regular rhythm and normal heart sounds.  Pulmonary/Chest: Effort normal. No respiratory distress.  Abdominal: Soft. Bowel sounds are normal. There is no abdominal tenderness. There is no rebound and no guarding.  Abdomen is soft, benign.  Musculoskeletal:        General: No edema. Normal range of motion.     Cervical back: Normal range of motion and neck supple.  Neurological: He is alert and oriented to person, place, and time.  Skin: Skin is warm. No erythema.  Psychiatric: He has a normal mood and affect. Judgment and thought content normal.  Vitals reviewed.   GI:  Lab Results: Recent Labs    03/23/19 1230  WBC 8.8  HGB 13.4  HCT 39.7  PLT 300   BMET No results for input(s): NA, K, CL, CO2, GLUCOSE, BUN, CREATININE, CALCIUM in the last 72 hours. LFT No results for input(s): PROT, ALBUMIN, AST, ALT, ALKPHOS, BILITOT, BILIDIR, IBILI in the last 72 hours. PT/INR No results for input(s): LABPROT, INR in the last 72 hours.   Studies/Results: DG Abd 2 Views  Result Date: 03/23/2019 CLINICAL DATA:  Abdominal pain. Abnormal upper GI and small-bowel  follow-through. EXAM: ABDOMEN - 2 VIEW COMPARISON:  Upper GI small-bowel follow-through 02/16/2019. CT abdomen and pelvis 02/09/2018. FINDINGS: Multiple prominently distended loops of small bowel noted. Relative paucity of colonic air. Findings most consistent with small bowel obstruction. No free air is identified. Residual barium noted over the left lower pelvis. No acute bony abnormality identified. IMPRESSION: Multiple prominently distended loops of small bowel noted most consistent with small bowel obstruction. Small-bowel obstruction may be secondary to previously identified ileal disease. Reference is made to prior upper GI small-bowel report of 02/16/2019 and CT abdomen and pelvis report of 02/09/2018. Electronically Signed   By: Marcello Moores  Register   On: 03/23/2019 10:37    Impression/Plan: -Abnormal x-ray concerning for small bowel obstruction.  Patient with Crohn's disease of terminal ileum with terminal ileal stricture and stenosis.  Currently on Humira and Imuran.  He has tried multiple course of prednisone in last 1 year without any significant improvement in symptoms.  Patient most likely has a fibrosing stricture rather than inflammatory stricture  Recommendations ------------------------ -Patient's physical exam and clinical symptoms are not concerning for acute obstruction.  Case discussed with ED physician Dr. Roderic Palau.  -I will change CT scan to CT enterography.  If CT scan negative for obstruction, patient can be discharged home with following changes in his medications.  -  increase frequency of Humira to every week.   - start  Prednisone 40 mg once a day with taper of 10 mg every week for total 4 weeks.   -If CT enterography shows small bowel obstruction, recommend surgery consultation.  -He may benefit from MR enterography differentiate between fibrosing inflammatory stricture  -GI will follow if patient gets admitted.  Otherwise I will arrange for follow-up with me in 2 weeks    LOS: 0 days   Otis Brace  MD, FACP 03/23/2019, 1:04 PM  Contact #  [phone]

## 2019-03-23 NOTE — Plan of Care (Signed)

## 2019-03-24 ENCOUNTER — Inpatient Hospital Stay (HOSPITAL_COMMUNITY): Payer: Medicare Other

## 2019-03-24 DIAGNOSIS — N200 Calculus of kidney: Secondary | ICD-10-CM | POA: Diagnosis not present

## 2019-03-24 DIAGNOSIS — K50912 Crohn's disease, unspecified, with intestinal obstruction: Secondary | ICD-10-CM | POA: Diagnosis not present

## 2019-03-24 DIAGNOSIS — E11649 Type 2 diabetes mellitus with hypoglycemia without coma: Secondary | ICD-10-CM | POA: Diagnosis not present

## 2019-03-24 DIAGNOSIS — E876 Hypokalemia: Secondary | ICD-10-CM | POA: Diagnosis present

## 2019-03-24 DIAGNOSIS — D509 Iron deficiency anemia, unspecified: Secondary | ICD-10-CM | POA: Diagnosis present

## 2019-03-24 DIAGNOSIS — Z8249 Family history of ischemic heart disease and other diseases of the circulatory system: Secondary | ICD-10-CM | POA: Diagnosis not present

## 2019-03-24 DIAGNOSIS — E1165 Type 2 diabetes mellitus with hyperglycemia: Secondary | ICD-10-CM | POA: Diagnosis present

## 2019-03-24 DIAGNOSIS — J302 Other seasonal allergic rhinitis: Secondary | ICD-10-CM | POA: Diagnosis present

## 2019-03-24 DIAGNOSIS — K56609 Unspecified intestinal obstruction, unspecified as to partial versus complete obstruction: Secondary | ICD-10-CM | POA: Diagnosis not present

## 2019-03-24 DIAGNOSIS — T380X5A Adverse effect of glucocorticoids and synthetic analogues, initial encounter: Secondary | ICD-10-CM | POA: Diagnosis present

## 2019-03-24 DIAGNOSIS — K5669 Other partial intestinal obstruction: Secondary | ICD-10-CM | POA: Diagnosis present

## 2019-03-24 DIAGNOSIS — R1084 Generalized abdominal pain: Secondary | ICD-10-CM | POA: Diagnosis not present

## 2019-03-24 DIAGNOSIS — Z79899 Other long term (current) drug therapy: Secondary | ICD-10-CM | POA: Diagnosis not present

## 2019-03-24 DIAGNOSIS — K219 Gastro-esophageal reflux disease without esophagitis: Secondary | ICD-10-CM | POA: Diagnosis present

## 2019-03-24 DIAGNOSIS — K50012 Crohn's disease of small intestine with intestinal obstruction: Secondary | ICD-10-CM | POA: Diagnosis present

## 2019-03-24 DIAGNOSIS — I10 Essential (primary) hypertension: Secondary | ICD-10-CM | POA: Diagnosis present

## 2019-03-24 DIAGNOSIS — Z7984 Long term (current) use of oral hypoglycemic drugs: Secondary | ICD-10-CM | POA: Diagnosis not present

## 2019-03-24 DIAGNOSIS — Z4682 Encounter for fitting and adjustment of non-vascular catheter: Secondary | ICD-10-CM | POA: Diagnosis not present

## 2019-03-24 DIAGNOSIS — Z882 Allergy status to sulfonamides status: Secondary | ICD-10-CM | POA: Diagnosis not present

## 2019-03-24 DIAGNOSIS — Z880 Allergy status to penicillin: Secondary | ICD-10-CM | POA: Diagnosis not present

## 2019-03-24 DIAGNOSIS — R918 Other nonspecific abnormal finding of lung field: Secondary | ICD-10-CM | POA: Diagnosis present

## 2019-03-24 DIAGNOSIS — Z6821 Body mass index (BMI) 21.0-21.9, adult: Secondary | ICD-10-CM | POA: Diagnosis not present

## 2019-03-24 DIAGNOSIS — E440 Moderate protein-calorie malnutrition: Secondary | ICD-10-CM | POA: Diagnosis present

## 2019-03-24 DIAGNOSIS — E872 Acidosis: Secondary | ICD-10-CM | POA: Diagnosis present

## 2019-03-24 DIAGNOSIS — Z20822 Contact with and (suspected) exposure to covid-19: Secondary | ICD-10-CM | POA: Diagnosis present

## 2019-03-24 LAB — CBC
HCT: 35.4 % — ABNORMAL LOW (ref 39.0–52.0)
Hemoglobin: 11.6 g/dL — ABNORMAL LOW (ref 13.0–17.0)
MCH: 30.1 pg (ref 26.0–34.0)
MCHC: 32.8 g/dL (ref 30.0–36.0)
MCV: 91.9 fL (ref 80.0–100.0)
Platelets: 268 10*3/uL (ref 150–400)
RBC: 3.85 MIL/uL — ABNORMAL LOW (ref 4.22–5.81)
RDW: 14 % (ref 11.5–15.5)
WBC: 6 10*3/uL (ref 4.0–10.5)
nRBC: 0 % (ref 0.0–0.2)

## 2019-03-24 LAB — GLUCOSE, CAPILLARY
Glucose-Capillary: 96 mg/dL (ref 70–99)
Glucose-Capillary: 110 mg/dL — ABNORMAL HIGH (ref 70–99)
Glucose-Capillary: 93 mg/dL (ref 70–99)
Glucose-Capillary: 77 mg/dL (ref 70–99)

## 2019-03-24 LAB — COMPREHENSIVE METABOLIC PANEL
ALT: 14 U/L (ref 0–44)
AST: 15 U/L (ref 15–41)
Albumin: 3.2 g/dL — ABNORMAL LOW (ref 3.5–5.0)
Alkaline Phosphatase: 45 U/L (ref 38–126)
Anion gap: 7 (ref 5–15)
BUN: 16 mg/dL (ref 8–23)
CO2: 25 mmol/L (ref 22–32)
Calcium: 8.2 mg/dL — ABNORMAL LOW (ref 8.9–10.3)
Chloride: 104 mmol/L (ref 98–111)
Creatinine, Ser: 0.89 mg/dL (ref 0.61–1.24)
GFR calc Af Amer: >60 mL/min (ref >60)
GFR calc non Af Amer: >60 mL/min (ref >60)
Glucose, Bld: 95 mg/dL (ref 70–99)
Potassium: 3.6 mmol/L (ref 3.5–5.1)
Sodium: 136 mmol/L (ref 135–145)
Total Bilirubin: 1.3 mg/dL — ABNORMAL HIGH (ref 0.3–1.2)
Total Protein: 6.2 g/dL — ABNORMAL LOW (ref 6.5–8.1)

## 2019-03-24 LAB — HIV ANTIBODY (ROUTINE TESTING W REFLEX): HIV Screen 4th Generation wRfx: NONREACTIVE

## 2019-03-24 MED ORDER — ACETAMINOPHEN 325 MG PO TABS
650.0000 mg | ORAL_TABLET | Freq: Four times a day (QID) | ORAL | Status: DC | PRN
  Administered 2019-03-24: 650 mg via ORAL
  Filled 2019-03-24: qty 2

## 2019-03-24 MED ORDER — DEXTROSE-NACL 5-0.9 % IV SOLN: INTRAVENOUS | Status: DC

## 2019-03-24 MED ORDER — TRAMADOL HCL 50 MG PO TABS: 50.0000 mg | ORAL_TABLET | Freq: Four times a day (QID) | ORAL | Status: DC | PRN

## 2019-03-24 MED ORDER — PHENOL 1.4 % MT LIQD
1.0000 | OROMUCOSAL | Status: DC | PRN
  Administered 2019-03-24: 1 via OROMUCOSAL
  Filled 2019-03-24: qty 177

## 2019-03-24 NOTE — Progress Notes (Signed)
PROGRESS NOTE    Eric Moody  WPY:[id number] DOB: 03/13/1950 DOA: 03/23/2019 PCP: Lavone Orn, MD  Brief Narrative: 69 y.o. male with medical history significant of Crohn's ileitis with history of terminal ileal stricture and ulceration admitted with abdominal pain worsening.  He had KUB as an outpatient which showed small bowel obstruction.  He was told to come to the ER by GI.  He denied nausea or vomiting.  He had a bowel movement on the day of admission he reports is a very small hard BM.  He denies any hematuria hematochezia or melena. He denies any fever chills chest pain shortness of breath or cough. He denies any urinary complaints.  ED Course: Patient had a CT of the abdomen in the ER-New moderate diffuse small bowel distension consistent with distal partial small bowel obstruction. Previously demonstrated long segment wall thickening of the terminal ileum has improved compared with the most recent CT of 13 months ago, and the obstruction may be related to a developing stricture. 2. No evidence of bowel perforation, abscess or fistula. 3. Nonobstructing right renal calculus. 4. Small subpleural nodules at both lung bases, some of which were not previously imaged. These are of doubtful significance, and no dedicated follow-up is required if this patient is low risk for bronchogenic carcinoma (and has no known or suspected primary neoplasm). Non-contrast chest CT can be considered in 12 months if patient is high-risk Assessment & Plan:   Active Problems:   SBO (small bowel obstruction) (HCC)  #1 partial small bowel obstruction in the setting of Crohn's disease.-Continue n.p.o. and IV fluids.  Continue NG suctioning.  Patient feels better than yesterday abdomen is more softer and less tender than yesterday.  Colonoscopy #2020 showed severe stenosis and ulceration at the terminal ileum unable to advance the scope with multiple tubular adenomas. Was on Humira and azathioprine as an  outpatient which is on hold at this time.  #2 type 2 diabetes with hyperglycemia patient on Amaryl at home which I will hold put him on SSI  #3 mild pseudohyponatremia secondary to hyperglycemia-resolved.  Sodium 136.     Estimated body mass index is 21.71 kg/m as calculated from the following:   Height as of this encounter: 5' 9"  (1.753 m).   Weight as of this encounter: 66.7 kg.  DVT prophylaxis: Lovenox Code Status: Full code Family Communication: None  disposition Plan: Patient came from home plan is for him to go back home once small bowel obstruction is resolved. Consultants:   GI and general surgery  Procedures: NG tube placed 03/23/2019 Antimicrobials: None  Subjective: Patient resting in bed feels better after the NG was placed with decreased abdominal pain Requesting something for headache  Objective: Vitals:   03/23/19 1730 03/23/19 1750 03/23/19 2114 03/24/19 0544  BP: 134/85 (!) 150/98 116/76 122/85  Pulse: 93 100 81 72  Resp: 16  16 16   Temp:  (!) 97.4 F (36.3 C) 97.7 F (36.5 C) 97.6 F (36.4 C)  TempSrc:  Oral Oral Oral  SpO2: 93% 98% 94% 94%  Weight:      Height:        Intake/Output Summary (Last 24 hours) at 03/24/2019 1104 Last data filed at 03/24/2019 0539 Gross per 24 hour  Intake 1100 ml  Output 1000 ml  Net 100 ml   Filed Weights   03/23/19 1156  Weight: 66.7 kg    Examination:  General exam: Appears calm and comfortable  Respiratory system: Clear to auscultation. Respiratory effort  normal. Cardiovascular system: S1 & S2 heard, RRR. No JVD, murmurs, rubs, gallops or clicks. No pedal edema. Gastrointestinal system: Abdomen is nondistended, soft and nontender. No organomegaly or masses felt. Normal bowel sounds heard. Central nervous system: Alert and oriented. No focal neurological deficits. Extremities: Symmetric 5 x 5 power. Skin: No rashes, lesions or ulcers Psychiatry: Judgement and insight appear normal. Mood & affect  appropriate.     Data Reviewed: I have personally reviewed following labs and imaging studies  CBC: Recent Labs  Lab 03/23/19 1230 03/24/19 0600  WBC 8.8 6.0  HGB 13.4 11.6*  HCT 39.7 35.4*  MCV 91.3 91.9  PLT 300 366   Basic Metabolic Panel: Recent Labs  Lab 03/23/19 1230 03/24/19 0600  NA 134* 136  K 4.0 3.6  CL 100 104  CO2 23 25  GLUCOSE 263* 95  BUN 29* 16  CREATININE 1.11 0.89  CALCIUM 8.7* 8.2*   GFR: Estimated Creatinine Clearance: 74.9 mL/min (by C-G formula based on SCr of 0.89 mg/dL). Liver Function Tests: Recent Labs  Lab 03/23/19 1230 03/24/19 0600  AST 17 15  ALT 14 14  ALKPHOS 55 45  BILITOT 1.6* 1.3*  PROT 6.9 6.2*  ALBUMIN 3.8 3.2*   Recent Labs  Lab 03/23/19 1230  LIPASE 20   No results for input(s): AMMONIA in the last 168 hours. Coagulation Profile: No results for input(s): INR, PROTIME in the last 168 hours. Cardiac Enzymes: No results for input(s): CKTOTAL, CKMB, CKMBINDEX, TROPONINI in the last 168 hours. BNP (last 3 results) No results for input(s): PROBNP in the last 8760 hours. HbA1C: Recent Labs    03/23/19 1230  HGBA1C 6.2*   CBG: Recent Labs  Lab 03/23/19 2111 03/24/19 0807  GLUCAP 173* 96   Lipid Profile: No results for input(s): CHOL, HDL, LDLCALC, TRIG, CHOLHDL, LDLDIRECT in the last 72 hours. Thyroid Function Tests: No results for input(s): TSH, T4TOTAL, FREET4, T3FREE, THYROIDAB in the last 72 hours. Anemia Panel: No results for input(s): VITAMINB12, FOLATE, FERRITIN, TIBC, IRON, RETICCTPCT in the last 72 hours. Sepsis Labs: No results for input(s): PROCALCITON, LATICACIDVEN in the last 168 hours.  Recent Results (from the past 240 hour(s))  Respiratory Panel by RT PCR (Flu A&B, Covid) - Nasopharyngeal Swab     Status: None   Collection Time: 03/23/19  4:55 PM   Specimen: Nasopharyngeal Swab  Result Value Ref Range Status   SARS Coronavirus 2 by RT PCR NEGATIVE NEGATIVE Final    Comment:  (NOTE) SARS-CoV-2 target nucleic acids are NOT DETECTED. The SARS-CoV-2 RNA is generally detectable in upper respiratoy specimens during the acute phase of infection. The lowest concentration of SARS-CoV-2 viral copies this assay can detect is 131 copies/mL. A negative result does not preclude SARS-Cov-2 infection and should not be used as the sole basis for treatment or other patient management decisions. A negative result may occur with  improper specimen collection/handling, submission of specimen other than nasopharyngeal swab, presence of viral mutation(s) within the areas targeted by this assay, and inadequate number of viral copies (<131 copies/mL). A negative result must be combined with clinical observations, patient history, and epidemiological information. The expected result is Negative. Fact Sheet for Patients:  PinkCheek.be Fact Sheet for Healthcare Providers:  GravelBags.it This test is not yet ap proved or cleared by the [place] FDA and  has been authorized for detection and/or diagnosis of SARS-CoV-2 by FDA under an Emergency Use Authorization (EUA). This EUA will remain  in effect (meaning this  test can be used) for the duration of the COVID-19 declaration under Section 564(b)(1) of the Act, 21 U.S.C. section 360bbb-3(b)(1), unless the authorization is terminated or revoked sooner.    Influenza A by PCR NEGATIVE NEGATIVE Final   Influenza B by PCR NEGATIVE NEGATIVE Final    Comment: (NOTE) The Xpert Xpress SARS-CoV-2/FLU/RSV assay is intended as an aid in  the diagnosis of influenza from Nasopharyngeal swab specimens and  should not be used as a sole basis for treatment. Nasal washings and  aspirates are unacceptable for Xpert Xpress SARS-CoV-2/FLU/RSV  testing. Fact Sheet for Patients: PinkCheek.be Fact Sheet for Healthcare  Providers: GravelBags.it This test is not yet approved or cleared by the [place] FDA and  has been authorized for detection and/or diagnosis of SARS-CoV-2 by  FDA under an Emergency Use Authorization (EUA). This EUA will remain  in effect (meaning this test can be used) for the duration of the  Covid-19 declaration under Section 564(b)(1) of the Act, 21  U.S.C. section 360bbb-3(b)(1), unless the authorization is  terminated or revoked. Performed at [facility], [place] [address] [place], [place] [address]          Radiology Studies: CT ENTERO ABD/PELVIS W CONTAST  Result Date: 03/23/2019 CLINICAL DATA:  Left lower abdominal pain for 12 days. History of Crohn's ileitis/inflammatory bowel disease. Small-bowel obstruction. EXAM: CT ABDOMEN AND PELVIS WITH CONTRAST (ENTEROGRAPHY) TECHNIQUE: Multidetector CT of the abdomen and pelvis during bolus administration of intravenous contrast. Negative oral contrast was given. CONTRAST:  160m OMNIPAQUE IOHEXOL 300 MG/ML  SOLN COMPARISON:  Abdominopelvic CT 02/09/2018 and 04/17/2014. Upper GI series 02/16/2019. FINDINGS: Lower chest: There are small subpleural nodules at both lung bases including a 5 mm nodule in the right lower lobe on image 21/6 which is unchanged. There are other nodules which were not previously imaged, including a 4 mm right lower lobe nodule on image 3/6, and a 6 mm left lower lobe nodule on image 6/6. No significant pleural or pericardial effusion. Hepatobiliary: The liver is normal in density without suspicious focal abnormality. No evidence of gallstones, gallbladder wall thickening or biliary dilatation. Pancreas: Unremarkable. No pancreatic ductal dilatation or surrounding inflammatory changes. Spleen: Normal in size without focal abnormality. Adrenals/Urinary Tract: Both adrenal glands appear normal. Single nonobstructing calculus in the interpolar region of the right kidney,  best seen on coronal image 115/7. No evidence of ureteral calculus or hydronephrosis. There are small left renal cysts, largest in the lower pole. The bladder appears normal. Stomach/Bowel: The stomach is fluid-filled and mildly distended. Several duodenal diverticula are again noted. There is moderate diffuse small bowel distension which is new compared with the prior CT. A long segment of wall thickening involving the terminal ileum is again noted, similar in distribution to the previous studies. The degree of wall thickening in these areas has improved compared with the most recent CT of 13 months ago. The colon appears decompressed without wall thickening. The appendix appears normal. No evidence of bowel perforation. Vascular/Lymphatic: There are no enlarged abdominal or pelvic lymph nodes. Minimal aortic and branch vessel atherosclerosis. The portal, superior mesenteric and splenic veins are patent. Reproductive: The prostate gland and seminal vesicles appear normal. Other: No evidence of abdominal wall mass or hernia. No ascites. Musculoskeletal: No acute or significant osseous findings. IMPRESSION: 1. New moderate diffuse small bowel distension consistent with distal partial small bowel obstruction. Previously demonstrated long segment wall thickening of the terminal ileum has improved compared with the most recent  CT of 13 months ago, and the obstruction may be related to a developing stricture. 2. No evidence of bowel perforation, abscess or fistula. 3. Nonobstructing right renal calculus. 4. Small subpleural nodules at both lung bases, some of which were not previously imaged. These are of doubtful significance, and no dedicated follow-up is required if this patient is low risk for bronchogenic carcinoma (and has no known or suspected primary neoplasm). Non-contrast chest CT can be considered in 12 months if patient is high-risk. This recommendation follows the consensus statement: Guidelines for  Management of Incidental Pulmonary Nodules Detected on CT Images: From the Fleischner Society 2017; Radiology 2017; 284:228-243. Electronically Signed   By: Richardean Sale M.D.   On: 03/23/2019 15:56   DG Abd 2 Views  Result Date: 03/23/2019 CLINICAL DATA:  Abdominal pain. Abnormal upper GI and small-bowel follow-through. EXAM: ABDOMEN - 2 VIEW COMPARISON:  Upper GI small-bowel follow-through 02/16/2019. CT abdomen and pelvis 02/09/2018. FINDINGS: Multiple prominently distended loops of small bowel noted. Relative paucity of colonic air. Findings most consistent with small bowel obstruction. No free air is identified. Residual barium noted over the left lower pelvis. No acute bony abnormality identified. IMPRESSION: Multiple prominently distended loops of small bowel noted most consistent with small bowel obstruction. Small-bowel obstruction may be secondary to previously identified ileal disease. Reference is made to prior upper GI small-bowel report of 02/16/2019 and CT abdomen and pelvis report of 02/09/2018. Electronically Signed   By: Marcello Moores  Register   On: 03/23/2019 10:37   DG Abd Portable 1V  Result Date: 03/23/2019 CLINICAL DATA:  Enteric catheter placement, Crohn disease EXAM: PORTABLE ABDOMEN - 1 VIEW COMPARISON:  03/23/2019 FINDINGS: Frontal view of the abdomen and pelvis excludes the pubic symphysis, hemidiaphragms, and right flank by collimation. Enteric catheter tip projects over gastric body. There is increasing gaseous distension of the small bowel consistent with obstruction. There is excreted contrast within the urinary bladder. No abdominal masses or abnormal calcifications. Barium filled diverticulum within the pelvis. IMPRESSION: 1. Progressive gaseous distention of the small bowel consistent with high-grade obstruction. 2. Enteric catheter overlying gastric body. Electronically Signed   By: Randa Ngo M.D.   On: 03/23/2019 19:16        Scheduled Meds: . enoxaparin  (LOVENOX) injection  40 mg Subcutaneous Q24H  . insulin aspart  0-6 Units Subcutaneous TID WC   Continuous Infusions: . sodium chloride 100 mL/hr at 03/24/19 0439     LOS: 0 days     Georgette Shell, MD  03/24/2019, 11:04 AM

## 2019-03-24 NOTE — Progress Notes (Signed)
Keinan JOAQUIN KNEBEL 1:00 PM  Subjective: Patient seen and examined in his hospital computer chart reviewed and his case discussed with my partner Dr. Alessandra Bevels and interesting his daughter just had Crohn's stricture surgery [place] and he is feeling better with the NG tube and not eating and his surgical note was discussed and we answered all of his questions  Objective: Vital signs stable afebrile no acute distress abdomen is soft nontender rare bowel sounds labs okay CT enterography reviewed  Assessment: Probable Crohn's stricture no obvious inflammation on CT enterography  Plan: Agree with surgical options if that is not done would agree with Dr. Leanna Sato note of outpatient steroids increasing Humira and close outpatient follow-up to see if he can be improved without surgery and please call me this weekend if I can be of any further assistance otherwise we will check on tomorrow  [facility]  office [phone] After 5PM or if no answer call [phone]

## 2019-03-24 NOTE — Progress Notes (Signed)
Subjective/Chief Complaint: nausea No flatus or BM Soft MINIMAL pain  NGT bilious    Objective: Vital signs in last 24 hours: Temp:  [97.4 F (36.3 C)-97.8 F (36.6 C)] 97.6 F (36.4 C) (02/27 0544) Pulse Rate:  [68-122] 72 (02/27 0544) Resp:  [15-18] 16 (02/27 0544) BP: (116-150)/(76-103) 122/85 (02/27 0544) SpO2:  [93 %-98 %] 94 % (02/27 0544) Weight:  [66.7 kg] 66.7 kg (02/26 1156) Last BM Date: 03/23/19  Intake/Output from previous day: 02/26 0701 - 02/27 0700 In: 1100 [I.V.:100; IV Piggyback:1000] Out: 875 [Urine:875] Intake/Output this shift: Total I/O In: -  Out: 125 [Urine:125]  General appearance: alert and cooperative Resp: clear to auscultation bilaterally GI: soft, non-tender; bowel sounds normal; no masses,  no organomegaly  Lab Results:  Recent Labs    03/23/19 1230 03/24/19 0600  WBC 8.8 6.0  HGB 13.4 11.6*  HCT 39.7 35.4*  PLT 300 268   BMET Recent Labs    03/23/19 1230 03/24/19 0600  NA 134* 136  K 4.0 3.6  CL 100 104  CO2 23 25  GLUCOSE 263* 95  BUN 29* 16  CREATININE 1.11 0.89  CALCIUM 8.7* 8.2*   PT/INR No results for input(s): LABPROT, INR in the last 72 hours. ABG No results for input(s): PHART, HCO3 in the last 72 hours.  Invalid input(s): PCO2, PO2  Studies/Results: CT ENTERO ABD/PELVIS W CONTAST  Result Date: 03/23/2019 CLINICAL DATA:  Left lower abdominal pain for 12 days. History of Crohn's ileitis/inflammatory bowel disease. Small-bowel obstruction. EXAM: CT ABDOMEN AND PELVIS WITH CONTRAST (ENTEROGRAPHY) TECHNIQUE: Multidetector CT of the abdomen and pelvis during bolus administration of intravenous contrast. Negative oral contrast was given. CONTRAST:  168m OMNIPAQUE IOHEXOL 300 MG/ML  SOLN COMPARISON:  Abdominopelvic CT 02/09/2018 and 04/17/2014. Upper GI series 02/16/2019. FINDINGS: Lower chest: There are small subpleural nodules at both lung bases including a 5 mm nodule in the right lower lobe on image 21/6  which is unchanged. There are other nodules which were not previously imaged, including a 4 mm right lower lobe nodule on image 3/6, and a 6 mm left lower lobe nodule on image 6/6. No significant pleural or pericardial effusion. Hepatobiliary: The liver is normal in density without suspicious focal abnormality. No evidence of gallstones, gallbladder wall thickening or biliary dilatation. Pancreas: Unremarkable. No pancreatic ductal dilatation or surrounding inflammatory changes. Spleen: Normal in size without focal abnormality. Adrenals/Urinary Tract: Both adrenal glands appear normal. Single nonobstructing calculus in the interpolar region of the right kidney, best seen on coronal image 115/7. No evidence of ureteral calculus or hydronephrosis. There are small left renal cysts, largest in the lower pole. The bladder appears normal. Stomach/Bowel: The stomach is fluid-filled and mildly distended. Several duodenal diverticula are again noted. There is moderate diffuse small bowel distension which is new compared with the prior CT. A long segment of wall thickening involving the terminal ileum is again noted, similar in distribution to the previous studies. The degree of wall thickening in these areas has improved compared with the most recent CT of 13 months ago. The colon appears decompressed without wall thickening. The appendix appears normal. No evidence of bowel perforation. Vascular/Lymphatic: There are no enlarged abdominal or pelvic lymph nodes. Minimal aortic and branch vessel atherosclerosis. The portal, superior mesenteric and splenic veins are patent. Reproductive: The prostate gland and seminal vesicles appear normal. Other: No evidence of abdominal wall mass or hernia. No ascites. Musculoskeletal: No acute or significant osseous findings. IMPRESSION: 1. New moderate  diffuse small bowel distension consistent with distal partial small bowel obstruction. Previously demonstrated long segment wall thickening  of the terminal ileum has improved compared with the most recent CT of 13 months ago, and the obstruction may be related to a developing stricture. 2. No evidence of bowel perforation, abscess or fistula. 3. Nonobstructing right renal calculus. 4. Small subpleural nodules at both lung bases, some of which were not previously imaged. These are of doubtful significance, and no dedicated follow-up is required if this patient is low risk for bronchogenic carcinoma (and has no known or suspected primary neoplasm). Non-contrast chest CT can be considered in 12 months if patient is high-risk. This recommendation follows the consensus statement: Guidelines for Management of Incidental Pulmonary Nodules Detected on CT Images: From the Fleischner Society 2017; Radiology 2017; 284:228-243. Electronically Signed   By: Richardean Sale M.D.   On: 03/23/2019 15:56   DG Abd 2 Views  Result Date: 03/23/2019 CLINICAL DATA:  Abdominal pain. Abnormal upper GI and small-bowel follow-through. EXAM: ABDOMEN - 2 VIEW COMPARISON:  Upper GI small-bowel follow-through 02/16/2019. CT abdomen and pelvis 02/09/2018. FINDINGS: Multiple prominently distended loops of small bowel noted. Relative paucity of colonic air. Findings most consistent with small bowel obstruction. No free air is identified. Residual barium noted over the left lower pelvis. No acute bony abnormality identified. IMPRESSION: Multiple prominently distended loops of small bowel noted most consistent with small bowel obstruction. Small-bowel obstruction may be secondary to previously identified ileal disease. Reference is made to prior upper GI small-bowel report of 02/16/2019 and CT abdomen and pelvis report of 02/09/2018. Electronically Signed   By: Marcello Moores  Register   On: 03/23/2019 10:37   DG Abd Portable 1V  Result Date: 03/23/2019 CLINICAL DATA:  Enteric catheter placement, Crohn disease EXAM: PORTABLE ABDOMEN - 1 VIEW COMPARISON:  03/23/2019 FINDINGS: Frontal view  of the abdomen and pelvis excludes the pubic symphysis, hemidiaphragms, and right flank by collimation. Enteric catheter tip projects over gastric body. There is increasing gaseous distension of the small bowel consistent with obstruction. There is excreted contrast within the urinary bladder. No abdominal masses or abnormal calcifications. Barium filled diverticulum within the pelvis. IMPRESSION: 1. Progressive gaseous distention of the small bowel consistent with high-grade obstruction. 2. Enteric catheter overlying gastric body. Electronically Signed   By: Randa Ngo M.D.   On: 03/23/2019 19:16    Anti-infectives: Anti-infectives (From admission, onward)   None      Assessment/Plan: HTN DM GERD  Crohns  - currently on Humira and azathioprine - last colonoscopy 11/2018 showed multiple tubular adenomas as well as severe stenosis and ulceration at terminal ileum/ unable to advance the scope   SBO, possibly secondary to stricture - CT enterography shows new moderate diffuse small bowel distension consistent with distal partial small bowel obstruction; previously demonstrated long segment wall thickening of the terminal ileum has improved compared with the most recent CT of 13  months ago, and the obstruction may be related to a developing stricture; no evidence of bowel perforation, abscess or fistula  More than likely will benefit from SB resection  Keep NGT for today Plan OR early next week unless he improves drastically   ID - none VTE - SCDs, lovenox FEN - IVF, NPO/NGT to LIWS Foley - none Follow up - TBD  Plan: Patient with several months of intermittent obstructive symptoms, worse over the last 2 weeks. CT enterography suggests this may be due to a developing stricture. He was noted to have severe  stenosis and ulceration at the terminal ileum on last colonoscopy 11/2018.  Gastroenterology following, considering MR enterography to differentiate between fibrosing and  inflammatory stricture.  At this time recommend NG tube. May require resection but will see how he does with decompression and await further gastroenterology recommendations.   LOS: 0 days    Eric Moody 03/24/2019

## 2019-03-25 LAB — GLUCOSE, CAPILLARY
Glucose-Capillary: 76 mg/dL (ref 70–99)
Glucose-Capillary: 77 mg/dL (ref 70–99)
Glucose-Capillary: 90 mg/dL (ref 70–99)
Glucose-Capillary: 101 mg/dL — ABNORMAL HIGH (ref 70–99)
Glucose-Capillary: 120 mg/dL — ABNORMAL HIGH (ref 70–99)
Glucose-Capillary: 121 mg/dL — ABNORMAL HIGH (ref 70–99)

## 2019-03-25 NOTE — Progress Notes (Signed)
Subjective/Chief Complaint:nausea NGT fell out replace  Very little if any bowel function    Objective: Vital signs in last 24 hours: Temp:  [98.1 F (36.7 C)-98.4 F (36.9 C)] 98.4 F (36.9 C) (02/28 0433) Pulse Rate:  [70-74] 70 (02/28 0433) Resp:  [16-18] 16 (02/28 0433) BP: (107-132)/(65-90) 132/90 (02/28 0433) SpO2:  [94 %-96 %] 96 % (02/28 0433) Last BM Date: 03/23/19  Intake/Output from previous day: 02/27 0701 - 02/28 0700 In: 1025.9 [I.V.:965.9; NG/GT:60] Out: 1230 [Urine:530; Emesis/NG output:700] Intake/Output this shift: Total I/O In: -  Out: 450 [Urine:450]  GI: soft minimally distended mild TTP RLQ without guarding   Lab Results:  Recent Labs    03/23/19 1230 03/24/19 0600  WBC 8.8 6.0  HGB 13.4 11.6*  HCT 39.7 35.4*  PLT 300 268   BMET Recent Labs    03/23/19 1230 03/24/19 0600  NA 134* 136  K 4.0 3.6  CL 100 104  CO2 23 25  GLUCOSE 263* 95  BUN 29* 16  CREATININE 1.11 0.89  CALCIUM 8.7* 8.2*   PT/INR No results for input(s): LABPROT, INR in the last 72 hours. ABG No results for input(s): PHART, HCO3 in the last 72 hours.  Invalid input(s): PCO2, PO2  Studies/Results: CT ENTERO ABD/PELVIS W CONTAST  Result Date: 03/23/2019 CLINICAL DATA:  Left lower abdominal pain for 12 days. History of Crohn's ileitis/inflammatory bowel disease. Small-bowel obstruction. EXAM: CT ABDOMEN AND PELVIS WITH CONTRAST (ENTEROGRAPHY) TECHNIQUE: Multidetector CT of the abdomen and pelvis during bolus administration of intravenous contrast. Negative oral contrast was given. CONTRAST:  154m OMNIPAQUE IOHEXOL 300 MG/ML  SOLN COMPARISON:  Abdominopelvic CT 02/09/2018 and 04/17/2014. Upper GI series 02/16/2019. FINDINGS: Lower chest: There are small subpleural nodules at both lung bases including a 5 mm nodule in the right lower lobe on image 21/6 which is unchanged. There are other nodules which were not previously imaged, including a 4 mm right lower lobe  nodule on image 3/6, and a 6 mm left lower lobe nodule on image 6/6. No significant pleural or pericardial effusion. Hepatobiliary: The liver is normal in density without suspicious focal abnormality. No evidence of gallstones, gallbladder wall thickening or biliary dilatation. Pancreas: Unremarkable. No pancreatic ductal dilatation or surrounding inflammatory changes. Spleen: Normal in size without focal abnormality. Adrenals/Urinary Tract: Both adrenal glands appear normal. Single nonobstructing calculus in the interpolar region of the right kidney, best seen on coronal image 115/7. No evidence of ureteral calculus or hydronephrosis. There are small left renal cysts, largest in the lower pole. The bladder appears normal. Stomach/Bowel: The stomach is fluid-filled and mildly distended. Several duodenal diverticula are again noted. There is moderate diffuse small bowel distension which is new compared with the prior CT. A long segment of wall thickening involving the terminal ileum is again noted, similar in distribution to the previous studies. The degree of wall thickening in these areas has improved compared with the most recent CT of 13 months ago. The colon appears decompressed without wall thickening. The appendix appears normal. No evidence of bowel perforation. Vascular/Lymphatic: There are no enlarged abdominal or pelvic lymph nodes. Minimal aortic and branch vessel atherosclerosis. The portal, superior mesenteric and splenic veins are patent. Reproductive: The prostate gland and seminal vesicles appear normal. Other: No evidence of abdominal wall mass or hernia. No ascites. Musculoskeletal: No acute or significant osseous findings. IMPRESSION: 1. New moderate diffuse small bowel distension consistent with distal partial small bowel obstruction. Previously demonstrated long segment wall thickening of  the terminal ileum has improved compared with the most recent CT of 13 months ago, and the obstruction may  be related to a developing stricture. 2. No evidence of bowel perforation, abscess or fistula. 3. Nonobstructing right renal calculus. 4. Small subpleural nodules at both lung bases, some of which were not previously imaged. These are of doubtful significance, and no dedicated follow-up is required if this patient is low risk for bronchogenic carcinoma (and has no known or suspected primary neoplasm). Non-contrast chest CT can be considered in 12 months if patient is high-risk. This recommendation follows the consensus statement: Guidelines for Management of Incidental Pulmonary Nodules Detected on CT Images: From the Fleischner Society 2017; Radiology 2017; 284:228-243. Electronically Signed   By: Richardean Sale M.D.   On: 03/23/2019 15:56   DG Abd Portable 1V  Result Date: 03/24/2019 CLINICAL DATA:  69 year old male status post NG placement. EXAM: PORTABLE ABDOMEN - 1 VIEW COMPARISON:  Abdominal radiograph dated 03/23/2019. FINDINGS: Enteric tube with tip and side-port in the right hemiabdomen. The tip is likely in the distal stomach or second portion of the duodenum. Dilated air-filled loops of small bowel measure up to 4.5 cm. The osseous structures and soft tissues are grossly unremarkable. IMPRESSION: 1. Enteric tube with tip in the distal stomach or second portion of the duodenum. 2. Persistent dilatation of small-bowel loops. Electronically Signed   By: Anner Crete M.D.   On: 03/24/2019 18:56   DG Abd Portable 1V  Result Date: 03/23/2019 CLINICAL DATA:  Enteric catheter placement, Crohn disease EXAM: PORTABLE ABDOMEN - 1 VIEW COMPARISON:  03/23/2019 FINDINGS: Frontal view of the abdomen and pelvis excludes the pubic symphysis, hemidiaphragms, and right flank by collimation. Enteric catheter tip projects over gastric body. There is increasing gaseous distension of the small bowel consistent with obstruction. There is excreted contrast within the urinary bladder. No abdominal masses or abnormal  calcifications. Barium filled diverticulum within the pelvis. IMPRESSION: 1. Progressive gaseous distention of the small bowel consistent with high-grade obstruction. 2. Enteric catheter overlying gastric body. Electronically Signed   By: Randa Ngo M.D.   On: 03/23/2019 19:16    Anti-infectives: Anti-infectives (From admission, onward)   None      Assessment/Plan:   HTN DM GERD  Crohns -currently on Humiraand azathioprine - last colonoscopy 11/2018 showedmultiple tubular adenomas as well as severe stenosis and ulceration at terminal ileum/ unableto advance the scope  SBO, possibly secondary to stricture -secondary to distal SB stricture  Plan OR next 1- 2 days per Dr Harlow Asa at this point  D/W Gerkin More than likely will benefit from SB resection  Keep NGT for today   ID -none VTE -SCDs, lovenox FEN -IVF, NPO/NGT to LIWS Foley -none Follow up -TBD   LOS: 1 day    Turner Daniels MD  03/25/2019

## 2019-03-25 NOTE — Progress Notes (Signed)
PROGRESS NOTE    Eric Moody  HFW:[id number] DOB: 08-03-1950 DOA: 03/23/2019 PCP: Lavone Orn, MD   Brief Narrative: 69 y.o.malewith medical history significant ofCrohn's ileitis with history of terminal ileal stricture and ulceration admitted with abdominal pain worsening. He had KUB as an outpatient which showed small bowel obstruction. He was told to come to the ER by GI. He denied nausea or vomiting. He had a bowel movement on the day of admission he reports is a very small hard BM. He denies any hematuria hematochezia or melena. He denies any fever chills chest pain shortness of breath or cough. He denies any urinary complaints.  ED Course:Patient had a CT of the abdomen in the ER-New moderate diffuse small bowel distension consistent with distal partial small bowel obstruction. Previously demonstrated long segment wall thickening of the terminal ileum has improved compared with the most recent CT of 13 months ago, and the obstruction may be related to a developing stricture. 2. No evidence of bowel perforation, abscess or fistula. 3. Nonobstructing right renal calculus. 4. Small subpleural nodules at both lung bases, some of which were not previously imaged. These are of doubtful significance, and no dedicated follow-up is required if this patient is low risk for bronchogenic carcinoma (and has no known or suspected primary neoplasm). Non-contrast chest CT can be considered in 12 months if patient is high-risk  Assessment & Plan:   Active Problems:   SBO (small bowel obstruction) (HCC)  #1 partial small bowel obstruction in the setting of Crohn's disease.-Continue n.p.o. and IV fluids.  Continue NG suctioning. Colonoscopy 11/2018 showed severe stenosis and ulceration at the terminal ileum unable to advance the scope with multiple tubular adenomas. Was on Humira and azathioprine as an outpatient which is on hold at this time. Plan for OR noted.  #2 type 2 diabetes  with hyperglycemia patient on Amaryl at home which I will hold put him on SSI CBG (last 3)  Recent Labs    03/25/19 0431 03/25/19 0809 03/25/19 1151  GLUCAP 77 90 101*     #3 mild pseudohyponatremia secondary to hyperglycemia-resolved.  Sodium 136.        Nutrition Problem: Inadequate oral intake Etiology: inability to eat, acute illness(SBO)     Signs/Symptoms: NPO status    Interventions: Refer to RD note for recommendations  Estimated body mass index is 21.71 kg/m as calculated from the following:   Height as of this encounter: 5' 9"  (1.753 m).   Weight as of this encounter: 66.7 kg.  DVT prophylaxis: Lovenox Code Status: Full code Family Communication: None  disposition Plan: Patient came from home plan is for him to go back home once small bowel obstruction is resolved. Consultants:   GI and general surgery  Procedures: NG tube placed 03/23/2019 Antimicrobials: None   Subjective: Patient reports ambulating in the hallway yesterday and NG tube partly sliding out had to be replaced Not had a bowel movement passing some gas Objective: Vitals:   03/24/19 0544 03/24/19 1321 03/24/19 2134 03/25/19 0433  BP: 122/85 128/88 107/65 132/90  Pulse: 72 70 74 70  Resp: 16  18 16   Temp: 97.6 F (36.4 C) 98.1 F (36.7 C) 98.2 F (36.8 C) 98.4 F (36.9 C)  TempSrc: Oral Oral Oral Oral  SpO2: 94% 94% 95% 96%  Weight:      Height:        Intake/Output Summary (Last 24 hours) at 03/25/2019 1154 Last data filed at 03/25/2019 1034 Gross per 24 hour  Intake 1025.88 ml  Output 1555 ml  Net -529.12 ml   Filed Weights   03/23/19 1156  Weight: 66.7 kg    Examination:  General exam: Appears calm and comfortable  Respiratory system: Clear to auscultation. Respiratory effort normal. Cardiovascular system: S1 & S2 heard, RRR. No JVD, murmurs, rubs, gallops or clicks. No pedal edema. Gastrointestinal system: Abdomen is nondistended, soft and mild tender. No  organomegaly or masses felt. Normal bowel sounds heard. Central nervous system: Alert and oriented. No focal neurological deficits. Extremities: Symmetric 5 x 5 power. Skin: No rashes, lesions or ulcers Psychiatry: Judgement and insight appear normal. Mood & affect appropriate.     Data Reviewed: I have personally reviewed following labs and imaging studies  CBC: Recent Labs  Lab 03/23/19 1230 03/24/19 0600  WBC 8.8 6.0  HGB 13.4 11.6*  HCT 39.7 35.4*  MCV 91.3 91.9  PLT 300 974   Basic Metabolic Panel: Recent Labs  Lab 03/23/19 1230 03/24/19 0600  NA 134* 136  K 4.0 3.6  CL 100 104  CO2 23 25  GLUCOSE 263* 95  BUN 29* 16  CREATININE 1.11 0.89  CALCIUM 8.7* 8.2*   GFR: Estimated Creatinine Clearance: 74.9 mL/min (by C-G formula based on SCr of 0.89 mg/dL). Liver Function Tests: Recent Labs  Lab 03/23/19 1230 03/24/19 0600  AST 17 15  ALT 14 14  ALKPHOS 55 45  BILITOT 1.6* 1.3*  PROT 6.9 6.2*  ALBUMIN 3.8 3.2*   Recent Labs  Lab 03/23/19 1230  LIPASE 20   No results for input(s): AMMONIA in the last 168 hours. Coagulation Profile: No results for input(s): INR, PROTIME in the last 168 hours. Cardiac Enzymes: No results for input(s): CKTOTAL, CKMB, CKMBINDEX, TROPONINI in the last 168 hours. BNP (last 3 results) No results for input(s): PROBNP in the last 8760 hours. HbA1C: Recent Labs    03/23/19 1230  HGBA1C 6.2*   CBG: Recent Labs  Lab 03/24/19 2145 03/25/19 0103 03/25/19 0431 03/25/19 0809 03/25/19 1151  GLUCAP 77 76 77 90 101*   Lipid Profile: No results for input(s): CHOL, HDL, LDLCALC, TRIG, CHOLHDL, LDLDIRECT in the last 72 hours. Thyroid Function Tests: No results for input(s): TSH, T4TOTAL, FREET4, T3FREE, THYROIDAB in the last 72 hours. Anemia Panel: No results for input(s): VITAMINB12, FOLATE, FERRITIN, TIBC, IRON, RETICCTPCT in the last 72 hours. Sepsis Labs: No results for input(s): PROCALCITON, LATICACIDVEN in the last  168 hours.  Recent Results (from the past 240 hour(s))  Respiratory Panel by RT PCR (Flu A&B, Covid) - Nasopharyngeal Swab     Status: None   Collection Time: 03/23/19  4:55 PM   Specimen: Nasopharyngeal Swab  Result Value Ref Range Status   SARS Coronavirus 2 by RT PCR NEGATIVE NEGATIVE Final    Comment: (NOTE) SARS-CoV-2 target nucleic acids are NOT DETECTED. The SARS-CoV-2 RNA is generally detectable in upper respiratoy specimens during the acute phase of infection. The lowest concentration of SARS-CoV-2 viral copies this assay can detect is 131 copies/mL. A negative result does not preclude SARS-Cov-2 infection and should not be used as the sole basis for treatment or other patient management decisions. A negative result may occur with  improper specimen collection/handling, submission of specimen other than nasopharyngeal swab, presence of viral mutation(s) within the areas targeted by this assay, and inadequate number of viral copies (<131 copies/mL). A negative result must be combined with clinical observations, patient history, and epidemiological information. The expected result is Negative. Fact Sheet for  Patients:  PinkCheek.be Fact Sheet for Healthcare Providers:  GravelBags.it This test is not yet ap proved or cleared by the [place] and  has been authorized for detection and/or diagnosis of SARS-CoV-2 by FDA under an Emergency Use Authorization (EUA). This EUA will remain  in effect (meaning this test can be used) for the duration of the COVID-19 declaration under Section 564(b)(1) of the Act, 21 U.S.C. section 360bbb-3(b)(1), unless the authorization is terminated or revoked sooner.    Influenza A by PCR NEGATIVE NEGATIVE Final   Influenza B by PCR NEGATIVE NEGATIVE Final    Comment: (NOTE) The Xpert Xpress SARS-CoV-2/FLU/RSV assay is intended as an aid in  the diagnosis of influenza from  Nasopharyngeal swab specimens and  should not be used as a sole basis for treatment. Nasal washings and  aspirates are unacceptable for Xpert Xpress SARS-CoV-2/FLU/RSV  testing. Fact Sheet for Patients: PinkCheek.be Fact Sheet for Healthcare Providers: GravelBags.it This test is not yet approved or cleared by the [place] FDA and  has been authorized for detection and/or diagnosis of SARS-CoV-2 by  FDA under an Emergency Use Authorization (EUA). This EUA will remain  in effect (meaning this test can be used) for the duration of the  Covid-19 declaration under Section 564(b)(1) of the Act, 21  U.S.C. section 360bbb-3(b)(1), unless the authorization is  terminated or revoked. Performed at [facility], [place] [address], [place], [place] [address]          Radiology Studies: CT ENTERO ABD/PELVIS W CONTAST  Result Date: 03/23/2019 CLINICAL DATA:  Left lower abdominal pain for 12 days. History of Crohn's ileitis/inflammatory bowel disease. Small-bowel obstruction. EXAM: CT ABDOMEN AND PELVIS WITH CONTRAST (ENTEROGRAPHY) TECHNIQUE: Multidetector CT of the abdomen and pelvis during bolus administration of intravenous contrast. Negative oral contrast was given. CONTRAST:  11m OMNIPAQUE IOHEXOL 300 MG/ML  SOLN COMPARISON:  Abdominopelvic CT 02/09/2018 and 04/17/2014. Upper GI series 02/16/2019. FINDINGS: Lower chest: There are small subpleural nodules at both lung bases including a 5 mm nodule in the right lower lobe on image 21/6 which is unchanged. There are other nodules which were not previously imaged, including a 4 mm right lower lobe nodule on image 3/6, and a 6 mm left lower lobe nodule on image 6/6. No significant pleural or pericardial effusion. Hepatobiliary: The liver is normal in density without suspicious focal abnormality. No evidence of gallstones, gallbladder wall thickening or biliary dilatation.  Pancreas: Unremarkable. No pancreatic ductal dilatation or surrounding inflammatory changes. Spleen: Normal in size without focal abnormality. Adrenals/Urinary Tract: Both adrenal glands appear normal. Single nonobstructing calculus in the interpolar region of the right kidney, best seen on coronal image 115/7. No evidence of ureteral calculus or hydronephrosis. There are small left renal cysts, largest in the lower pole. The bladder appears normal. Stomach/Bowel: The stomach is fluid-filled and mildly distended. Several duodenal diverticula are again noted. There is moderate diffuse small bowel distension which is new compared with the prior CT. A long segment of wall thickening involving the terminal ileum is again noted, similar in distribution to the previous studies. The degree of wall thickening in these areas has improved compared with the most recent CT of 13 months ago. The colon appears decompressed without wall thickening. The appendix appears normal. No evidence of bowel perforation. Vascular/Lymphatic: There are no enlarged abdominal or pelvic lymph nodes. Minimal aortic and branch vessel atherosclerosis. The portal, superior mesenteric and splenic veins are patent. Reproductive: The prostate gland and seminal vesicles  appear normal. Other: No evidence of abdominal wall mass or hernia. No ascites. Musculoskeletal: No acute or significant osseous findings. IMPRESSION: 1. New moderate diffuse small bowel distension consistent with distal partial small bowel obstruction. Previously demonstrated long segment wall thickening of the terminal ileum has improved compared with the most recent CT of 13 months ago, and the obstruction may be related to a developing stricture. 2. No evidence of bowel perforation, abscess or fistula. 3. Nonobstructing right renal calculus. 4. Small subpleural nodules at both lung bases, some of which were not previously imaged. These are of doubtful significance, and no dedicated  follow-up is required if this patient is low risk for bronchogenic carcinoma (and has no known or suspected primary neoplasm). Non-contrast chest CT can be considered in 12 months if patient is high-risk. This recommendation follows the consensus statement: Guidelines for Management of Incidental Pulmonary Nodules Detected on CT Images: From the Fleischner Society 2017; Radiology 2017; 284:228-243. Electronically Signed   By: Richardean Sale M.D.   On: 03/23/2019 15:56   DG Abd Portable 1V  Result Date: 03/24/2019 CLINICAL DATA:  69 year old male status post NG placement. EXAM: PORTABLE ABDOMEN - 1 VIEW COMPARISON:  Abdominal radiograph dated 03/23/2019. FINDINGS: Enteric tube with tip and side-port in the right hemiabdomen. The tip is likely in the distal stomach or second portion of the duodenum. Dilated air-filled loops of small bowel measure up to 4.5 cm. The osseous structures and soft tissues are grossly unremarkable. IMPRESSION: 1. Enteric tube with tip in the distal stomach or second portion of the duodenum. 2. Persistent dilatation of small-bowel loops. Electronically Signed   By: Anner Crete M.D.   On: 03/24/2019 18:56   DG Abd Portable 1V  Result Date: 03/23/2019 CLINICAL DATA:  Enteric catheter placement, Crohn disease EXAM: PORTABLE ABDOMEN - 1 VIEW COMPARISON:  03/23/2019 FINDINGS: Frontal view of the abdomen and pelvis excludes the pubic symphysis, hemidiaphragms, and right flank by collimation. Enteric catheter tip projects over gastric body. There is increasing gaseous distension of the small bowel consistent with obstruction. There is excreted contrast within the urinary bladder. No abdominal masses or abnormal calcifications. Barium filled diverticulum within the pelvis. IMPRESSION: 1. Progressive gaseous distention of the small bowel consistent with high-grade obstruction. 2. Enteric catheter overlying gastric body. Electronically Signed   By: Randa Ngo M.D.   On: 03/23/2019  19:16        Scheduled Meds: . enoxaparin (LOVENOX) injection  40 mg Subcutaneous Q24H  . insulin aspart  0-6 Units Subcutaneous TID WC   Continuous Infusions: . sodium chloride 100 mL/hr at 03/24/19 0439  . dextrose 5 % and 0.9% NaCl 75 mL/hr at 03/24/19 2337     LOS: 1 day     Georgette Shell, MD 03/25/2019, 11:54 AM

## 2019-03-25 NOTE — Progress Notes (Signed)
Initial Nutrition Assessment  RD working remotely.   DOCUMENTATION CODES:   Not applicable  INTERVENTION:  - diet advancement as medically feasible. - will monitor for need for nutrition support.    NUTRITION DIAGNOSIS:   Inadequate oral intake related to inability to eat, acute illness(SBO) as evidenced by NPO status.  GOAL:   Patient will meet greater than or equal to 90% of their needs  MONITOR:   Diet advancement, Labs, Weight trends, I & O's  REASON FOR ASSESSMENT:   Malnutrition Screening Tool  ASSESSMENT:   69 y.o. male with medical history significant of Crohn's ileitis with history of terminal ileal stricture and ulceration. He was admitted due to worsening abdominal pain; no N/V PTA. KUB showed SBO. Noted to have small sub-pleural nodules at both lung bases.  Patient has been NPO since admission. NGT was placed in L nare on 2/26 and flow sheet indicates 600 ml output during day shift yesterday and 100 ml output overnight.   Per chart review, weight on 2/26 was 147 lb and PTA the most recently recorded weight was on 01/23/15 when he weighed 160 lb. This indicates 13 lb weight loss (8% body weight) in the past 4 years; not significant for time frame but unsure if weight loss occurred more acutely.  Patient is on airborne/contact precautions without indication as to why.   Per notes: - partial SBO - NGT fell out over night and patient refusing replacement  - GI consulted and Surgery consulted - CT enterography was suggestive of developing stricture - possible need for resection - mild pseudohyponatremia 2/2 hyperglycemia--resolved    Labs reviewed; CBG: 76, 77, 90 mg/dl, Ca: 8.2 mg/dl. Medications reviewed; sliding scale novolog. IVF; D5-NS @ 75 ml/hr (306 kcal)     NUTRITION - FOCUSED PHYSICAL EXAM:  unable to complete at this time.   Diet Order:   Diet Order            Diet NPO time specified  Diet effective now              EDUCATION  NEEDS:   Not appropriate for education at this time  Skin:  Skin Assessment: Reviewed RN Assessment  Last BM:  2/26  Height:   Ht Readings from Last 1 Encounters:  03/23/19 5' 9"  (1.753 m)    Weight:   Wt Readings from Last 1 Encounters:  03/23/19 66.7 kg    Ideal Body Weight:  72.7 kg  BMI:  Body mass index is 21.71 kg/m.  Estimated Nutritional Needs:   Kcal:  1900-2100 kcal  Protein:  95-110 grams  Fluid:  >/= 2 L/day     Jarome Matin, MS, RD, LDN, CNSC Inpatient Clinical Dietitian RD pager # available in [facility]  After hours/weekend pager # available in [facility]

## 2019-03-25 NOTE — Progress Notes (Signed)
Eric Moody 8:49 AM  Subjective: Patient in good spirits is doing some walking has not had a bowel movement and only minimal gas from below and unfortunately his NG fell out and he did not like it being replaced and he has no new complaints  Objective: Vital signs stable afebrile no acute distress abdomen is soft essentially nontender no guarding or rebound occasional bowel sounds No new labs or x-rays Assessment: Crohn's disease with primarily stricturing disease  Plan: Await surgical options and will discuss with Dr. Alessandra Bevels if postop he should resume Humira or not or consider a trial of 5-ASA at that point  [facility]  office [phone] After 5PM or if no answer call [phone]

## 2019-03-26 ENCOUNTER — Inpatient Hospital Stay (HOSPITAL_COMMUNITY): Payer: Medicare Other

## 2019-03-26 LAB — COMPREHENSIVE METABOLIC PANEL
ALT: 11 U/L (ref 0–44)
AST: 15 U/L (ref 15–41)
Albumin: 3.4 g/dL — ABNORMAL LOW (ref 3.5–5.0)
Alkaline Phosphatase: 51 U/L (ref 38–126)
Anion gap: 10 (ref 5–15)
BUN: 10 mg/dL (ref 8–23)
CO2: 22 mmol/L (ref 22–32)
Calcium: 8.3 mg/dL — ABNORMAL LOW (ref 8.9–10.3)
Chloride: 104 mmol/L (ref 98–111)
Creatinine, Ser: 0.86 mg/dL (ref 0.61–1.24)
GFR calc Af Amer: >60 mL/min (ref >60)
GFR calc non Af Amer: >60 mL/min (ref >60)
Glucose, Bld: 121 mg/dL — ABNORMAL HIGH (ref 70–99)
Potassium: 3.4 mmol/L — ABNORMAL LOW (ref 3.5–5.1)
Sodium: 136 mmol/L (ref 135–145)
Total Bilirubin: 1.3 mg/dL — ABNORMAL HIGH (ref 0.3–1.2)
Total Protein: 6.7 g/dL (ref 6.5–8.1)

## 2019-03-26 LAB — GLUCOSE, CAPILLARY
Glucose-Capillary: 104 mg/dL — ABNORMAL HIGH (ref 70–99)
Glucose-Capillary: 116 mg/dL — ABNORMAL HIGH (ref 70–99)
Glucose-Capillary: 121 mg/dL — ABNORMAL HIGH (ref 70–99)
Glucose-Capillary: 125 mg/dL — ABNORMAL HIGH (ref 70–99)
Glucose-Capillary: 137 mg/dL — ABNORMAL HIGH (ref 70–99)
Glucose-Capillary: 160 mg/dL — ABNORMAL HIGH (ref 70–99)
Glucose-Capillary: 95 mg/dL (ref 70–99)

## 2019-03-26 LAB — CBC
HCT: 36.2 % — ABNORMAL LOW (ref 39.0–52.0)
Hemoglobin: 12.3 g/dL — ABNORMAL LOW (ref 13.0–17.0)
MCH: 30.4 pg (ref 26.0–34.0)
MCHC: 34 g/dL (ref 30.0–36.0)
MCV: 89.4 fL (ref 80.0–100.0)
Platelets: 266 10*3/uL (ref 150–400)
RBC: 4.05 MIL/uL — ABNORMAL LOW (ref 4.22–5.81)
RDW: 13.5 % (ref 11.5–15.5)
WBC: 6.7 10*3/uL (ref 4.0–10.5)
nRBC: 0 % (ref 0.0–0.2)

## 2019-03-26 MED ORDER — HYDROMORPHONE HCL 1 MG/ML IJ SOLN
0.5000 mg | INTRAMUSCULAR | Status: DC | PRN
  Administered 2019-03-26 – 2019-03-28 (×4): 0.5 mg via INTRAVENOUS
  Filled 2019-03-26 (×4): qty 0.5

## 2019-03-26 MED ORDER — GADOBUTROL 1 MMOL/ML IV SOLN
7.0000 mL | Freq: Once | INTRAVENOUS | Status: AC | PRN
  Administered 2019-03-26: 7 mL via INTRAVENOUS

## 2019-03-26 MED ORDER — ENOXAPARIN SODIUM 40 MG/0.4ML ~~LOC~~ SOLN
40.0000 mg | SUBCUTANEOUS | Status: DC
  Administered 2019-03-26 – 2019-03-30 (×5): 40 mg via SUBCUTANEOUS
  Filled 2019-03-26 (×5): qty 0.4

## 2019-03-26 NOTE — Progress Notes (Addendum)
Pt is returning for more MRI films via wheelchair, NG clamped. IV stopped.  2125 returned to room

## 2019-03-26 NOTE — Progress Notes (Signed)
Eric Moody 9:34 AM  Subjective: The patient is doing about the same and did move his bowels and we answered all of his questions and he has no new complaints  Objective: Vital signs stable afebrile abdomen is soft occasional bowel sounds slightly increased right lower quadrant pain today  Assessment: Crohn's disease with stricturing  Plan: Probable surgical options tomorrow and the surgeons may want to discuss his case with my partner Dr. Alessandra Bevels who is available in our office if needed  [facility]  office [phone] After 5PM or if no answer call [phone]

## 2019-03-26 NOTE — Progress Notes (Signed)
PROGRESS NOTE    Eric Moody  IZT:[id number] DOB: 01/21/1951 DOA: 03/23/2019 PCP: Lavone Orn, MD    Brief Narrative: 69 y.o.malewith medical history significant ofCrohn's ileitis with history of terminal ileal stricture and ulceration admitted with abdominal pain worsening. He had KUB as an outpatient which showed small bowel obstruction. He was told to come to the ER by GI. He denied nausea or vomiting. He had a bowel movement on the day of admission he reports is a very small hard BM. He denies any hematuria hematochezia or melena. He denies any fever chills chest pain shortness of breath or cough. He denies any urinary complaints.  ED Course:Patient had a CT of the abdomen in the ER-New moderate diffuse small bowel distension consistent with distal partial small bowel obstruction. Previously demonstrated long segment wall thickening of the terminal ileum has improved compared with the most recent CT of 13 months ago, and the obstruction may be related to a developing stricture. 2. No evidence of bowel perforation, abscess or fistula. 3. Nonobstructing right renal calculus. 4. Small subpleural nodules at both lung bases, some of which were not previously imaged. These are of doubtful significance, and no dedicated follow-up is required if this patient is low risk for bronchogenic carcinoma (and has no known or suspected primary neoplasm). Non-contrast chest CT can be considered in 12 months if patient is high-risk  Assessment & Plan:   Active Problems:   SBO (small bowel obstruction) (HCC)   #1 partial small bowel obstructionin the setting of Crohn's disease with stricture-.-Continue n.p.o. and IV fluids. Continue NG suctioning.Colonoscopy 11/2018 showed severe stenosis and ulceration at the terminal ileum unable to advance the scope with multiple tubular adenomas. Was on Humira and azathioprine as an outpatient which is on hold at this time. Plan for OR  tomorrow  #2 type 2 diabetes with hyperglycemia patient on Amaryl at home which I will hold put him on SSI CBG (last 3)  Recent Labs    03/26/19 0350 03/26/19 0738 03/26/19 1122  GLUCAP 116* 160* 125*         Nutrition Problem: Inadequate oral intake Etiology: inability to eat, acute illness(SBO)     Signs/Symptoms: NPO status    Interventions: Refer to RD note for recommendations  Estimated body mass index is 21.71 kg/m as calculated from the following:   Height as of this encounter: 5' 9"  (1.753 m).   Weight as of this encounter: 66.7 kg.  DVT prophylaxis: lovenox Code Status: full Family Communication: none Disposition Plan: Patient came from home plan is for him to go back home once small bowel obstruction is resolved. Consultants: gi surgery  Procedures: ngt 2/26 Antimicrobials:none  Subjective:  Had a bm ngt suctioning  Objective: Vitals:   03/25/19 1332 03/25/19 2107 03/25/19 2351 03/26/19 0530  BP: 128/90 (!) 146/94 (!) 153/79 (!) 143/87  Pulse: 94 82 84 80  Resp: 20 19 18 17   Temp: 97.6 F (36.4 C) 98.2 F (36.8 C) 98.3 F (36.8 C) 98.3 F (36.8 C)  TempSrc: Oral  Oral Oral  SpO2: 99% 97% 94% 97%  Weight:      Height:        Intake/Output Summary (Last 24 hours) at 03/26/2019 1221 Last data filed at 03/26/2019 0926 Gross per 24 hour  Intake 3025 ml  Output 800 ml  Net 2225 ml   Filed Weights   03/23/19 1156  Weight: 66.7 kg    Examination:  General exam: Appears calm and comfortable  Respiratory system: Clear to auscultation. Respiratory effort normal. Cardiovascular system: S1 & S2 heard, RRR. No JVD, murmurs, rubs, gallops or clicks. No pedal edema. Gastrointestinal system: Abdomen is nondistended, soft and tender. No organomegaly or masses felt. Normal bowel sounds heard. Central nervous system: Alert and oriented. No focal neurological deficits. Extremities: Symmetric 5 x 5 power. Skin: No rashes, lesions or  ulcers Psychiatry: Judgement and insight appear normal. Mood & affect appropriate.     Data Reviewed: I have personally reviewed following labs and imaging studies  CBC: Recent Labs  Lab 03/23/19 1230 03/24/19 0600  WBC 8.8 6.0  HGB 13.4 11.6*  HCT 39.7 35.4*  MCV 91.3 91.9  PLT 300 250   Basic Metabolic Panel: Recent Labs  Lab 03/23/19 1230 03/24/19 0600  NA 134* 136  K 4.0 3.6  CL 100 104  CO2 23 25  GLUCOSE 263* 95  BUN 29* 16  CREATININE 1.11 0.89  CALCIUM 8.7* 8.2*   GFR: Estimated Creatinine Clearance: 74.9 mL/min (by C-G formula based on SCr of 0.89 mg/dL). Liver Function Tests: Recent Labs  Lab 03/23/19 1230 03/24/19 0600  AST 17 15  ALT 14 14  ALKPHOS 55 45  BILITOT 1.6* 1.3*  PROT 6.9 6.2*  ALBUMIN 3.8 3.2*   Recent Labs  Lab 03/23/19 1230  LIPASE 20   No results for input(s): AMMONIA in the last 168 hours. Coagulation Profile: No results for input(s): INR, PROTIME in the last 168 hours. Cardiac Enzymes: No results for input(s): CKTOTAL, CKMB, CKMBINDEX, TROPONINI in the last 168 hours. BNP (last 3 results) No results for input(s): PROBNP in the last 8760 hours. HbA1C: Recent Labs    03/23/19 1230  HGBA1C 6.2*   CBG: Recent Labs  Lab 03/25/19 2106 03/26/19 0127 03/26/19 0350 03/26/19 0738 03/26/19 1122  GLUCAP 121* 121* 116* 160* 125*   Lipid Profile: No results for input(s): CHOL, HDL, LDLCALC, TRIG, CHOLHDL, LDLDIRECT in the last 72 hours. Thyroid Function Tests: No results for input(s): TSH, T4TOTAL, FREET4, T3FREE, THYROIDAB in the last 72 hours. Anemia Panel: No results for input(s): VITAMINB12, FOLATE, FERRITIN, TIBC, IRON, RETICCTPCT in the last 72 hours. Sepsis Labs: No results for input(s): PROCALCITON, LATICACIDVEN in the last 168 hours.  Recent Results (from the past 240 hour(s))  Respiratory Panel by RT PCR (Flu A&B, Covid) - Nasopharyngeal Swab     Status: None   Collection Time: 03/23/19  4:55 PM   Specimen:  Nasopharyngeal Swab  Result Value Ref Range Status   SARS Coronavirus 2 by RT PCR NEGATIVE NEGATIVE Final    Comment: (NOTE) SARS-CoV-2 target nucleic acids are NOT DETECTED. The SARS-CoV-2 RNA is generally detectable in upper respiratoy specimens during the acute phase of infection. The lowest concentration of SARS-CoV-2 viral copies this assay can detect is 131 copies/mL. A negative result does not preclude SARS-Cov-2 infection and should not be used as the sole basis for treatment or other patient management decisions. A negative result may occur with  improper specimen collection/handling, submission of specimen other than nasopharyngeal swab, presence of viral mutation(s) within the areas targeted by this assay, and inadequate number of viral copies (<131 copies/mL). A negative result must be combined with clinical observations, patient history, and epidemiological information. The expected result is Negative. Fact Sheet for Patients:  PinkCheek.be Fact Sheet for Healthcare Providers:  GravelBags.it This test is not yet ap proved or cleared by the [place] FDA and  has been authorized for detection and/or diagnosis of SARS-CoV-2 by  FDA under an Emergency Use Authorization (EUA). This EUA will remain  in effect (meaning this test can be used) for the duration of the COVID-19 declaration under Section 564(b)(1) of the Act, 21 U.S.C. section 360bbb-3(b)(1), unless the authorization is terminated or revoked sooner.    Influenza A by PCR NEGATIVE NEGATIVE Final   Influenza B by PCR NEGATIVE NEGATIVE Final    Comment: (NOTE) The Xpert Xpress SARS-CoV-2/FLU/RSV assay is intended as an aid in  the diagnosis of influenza from Nasopharyngeal swab specimens and  should not be used as a sole basis for treatment. Nasal washings and  aspirates are unacceptable for Xpert Xpress SARS-CoV-2/FLU/RSV  testing. Fact Sheet for  Patients: PinkCheek.be Fact Sheet for Healthcare Providers: GravelBags.it This test is not yet approved or cleared by the [place] FDA and  has been authorized for detection and/or diagnosis of SARS-CoV-2 by  FDA under an Emergency Use Authorization (EUA). This EUA will remain  in effect (meaning this test can be used) for the duration of the  Covid-19 declaration under Section 564(b)(1) of the Act, 21  U.S.C. section 360bbb-3(b)(1), unless the authorization is  terminated or revoked. Performed at [facility], [place] [address], [place], [place] [address]          Radiology Studies: DG Abd Portable 1V  Result Date: 03/24/2019 CLINICAL DATA:  69 year old male status post NG placement. EXAM: PORTABLE ABDOMEN - 1 VIEW COMPARISON:  Abdominal radiograph dated 03/23/2019. FINDINGS: Enteric tube with tip and side-port in the right hemiabdomen. The tip is likely in the distal stomach or second portion of the duodenum. Dilated air-filled loops of small bowel measure up to 4.5 cm. The osseous structures and soft tissues are grossly unremarkable. IMPRESSION: 1. Enteric tube with tip in the distal stomach or second portion of the duodenum. 2. Persistent dilatation of small-bowel loops. Electronically Signed   By: Anner Crete M.D.   On: 03/24/2019 18:56        Scheduled Meds: . enoxaparin (LOVENOX) injection  40 mg Subcutaneous Q24H  . insulin aspart  0-6 Units Subcutaneous TID WC   Continuous Infusions: . sodium chloride 100 mL/hr at 03/24/19 0439     LOS: 2 days     Georgette Shell, MD 03/26/2019, 12:21 PM

## 2019-03-26 NOTE — Progress Notes (Signed)
CC: SBO  Subjective: Patient with no real significant improvement.  He is passing some gas.  He has not loud high-pitched bowel sounds.  No other significant change.  Objective: Vital signs in last 24 hours: Temp:  [97.6 F (36.4 C)-98.3 F (36.8 C)] 98.3 F (36.8 C) (03/01 0530) Pulse Rate:  [80-94] 80 (03/01 0530) Resp:  [17-20] 17 (03/01 0530) BP: (128-153)/(79-94) 143/87 (03/01 0530) SpO2:  [94 %-99 %] 97 % (03/01 0530) Last BM Date: 03/23/19 N.p.o. 3025 IV/NG 800 urine NG not recorded No BM recorded Afebrile vital signs are stable Last labs 2/27 CT enterography 03/23/2019: Shows long segment of wall thickening involving the terminal ileum noted again degree of wall thickening is improved compared with the CT 13 months ago.  The colon appeared decompressed. Intake/Output from previous day: 02/28 0701 - 03/01 0700 In: 3025 [I.V.:2725; NG/GT:300] Out: 800 [Urine:800] Intake/Output this shift: Total I/O In: 0  Out: 450 [Urine:200; Emesis/NG output:250]  General appearance: alert, cooperative and no distress Resp: clear to auscultation bilaterally Cardio: Regular rate and rhythm GI: Soft, he is not really distended.  He is tender on palpation.  NG tube is functioning.  I irrigated it is working not draining much.  Minimal flatus/no BM. Extremities: extremities normal, atraumatic, no cyanosis or edema  Lab Results:  Recent Labs    03/23/19 1230 03/24/19 0600  WBC 8.8 6.0  HGB 13.4 11.6*  HCT 39.7 35.4*  PLT 300 268    BMET Recent Labs    03/23/19 1230 03/24/19 0600  NA 134* 136  K 4.0 3.6  CL 100 104  CO2 23 25  GLUCOSE 263* 95  BUN 29* 16  CREATININE 1.11 0.89  CALCIUM 8.7* 8.2*   PT/INR No results for input(s): LABPROT, INR in the last 72 hours.  Recent Labs  Lab 03/23/19 1230 03/24/19 0600  AST 17 15  ALT 14 14  ALKPHOS 55 45  BILITOT 1.6* 1.3*  PROT 6.9 6.2*  ALBUMIN 3.8 3.2*     Lipase     Component Value Date/Time   LIPASE  20 03/23/2019 1230   Prior to Admission medications   Medication Sig Start Date End Date Taking? Authorizing Provider  azaTHIOprine (IMURAN) 50 MG tablet Take 150 mg by mouth daily. 03/06/19  Yes [provider]  fexofenadine (ALLEGRA) 180 MG tablet Take 180 mg by mouth daily.   Yes [provider]  fluticasone (FLONASE) 50 MCG/ACT nasal spray Place 1 spray into both nostrils daily as needed for allergies or rhinitis.   Yes [provider]  glimepiride (AMARYL) 2 MG tablet Take 2 mg by mouth daily with breakfast.   Yes [provider]  guaiFENesin (MUCINEX) 600 MG 12 hr tablet Take 600 mg by mouth 2 (two) times daily as needed for cough.   Yes [provider]  lisinopril (PRINIVIL,ZESTRIL) 10 MG tablet Take 10 mg by mouth daily.   Yes [provider]  pantoprazole (PROTONIX) 40 MG tablet Take 40 mg by mouth daily. 03/06/19  Yes [provider]      Medications: . enoxaparin (LOVENOX) injection  40 mg Subcutaneous Q24H  . insulin aspart  0-6 Units Subcutaneous TID WC    Assessment/Plan HTN DM GERD  Crohns -currently on Humiraand azathioprine - last colonoscopy 11/2018 showedmultiple tubular adenomas as well as severe stenosis and ulceration at terminal ileum/ unableto advance the scope  SBO, possibly secondary to stricture  - admit 2/26 -secondary to distal SB stricture  Plan OR next 1- 2 days per Dr Harlow Asa at this point  D/W Gerkin2/28/21 More than likely will benefit from SB resection  Keep NGT for today   ID -none VTE -SCDs, lovenox FEN -IVF, NPO/NGT to LIWS Foley -none Follow up -TBD   Plan: Tentatively aiming for small bowel resection tomorrow.  I will update his labs in the a.m.  Dr.Gerkin will discuss with him later today.  LOS: 2 days    Bobbye Petti 03/26/2019 Please see Amion

## 2019-03-27 ENCOUNTER — Ambulatory Visit: Payer: Medicare Other

## 2019-03-27 LAB — GLUCOSE, CAPILLARY
Glucose-Capillary: 80 mg/dL (ref 70–99)
Glucose-Capillary: 86 mg/dL (ref 70–99)
Glucose-Capillary: 104 mg/dL — ABNORMAL HIGH (ref 70–99)
Glucose-Capillary: 87 mg/dL (ref 70–99)
Glucose-Capillary: 83 mg/dL (ref 70–99)

## 2019-03-27 LAB — BASIC METABOLIC PANEL
Anion gap: 13 (ref 5–15)
BUN: 11 mg/dL (ref 8–23)
CO2: 22 mmol/L (ref 22–32)
Calcium: 8.4 mg/dL — ABNORMAL LOW (ref 8.9–10.3)
Chloride: 101 mmol/L (ref 98–111)
Creatinine, Ser: 0.87 mg/dL (ref 0.61–1.24)
GFR calc Af Amer: >60 mL/min (ref >60)
GFR calc non Af Amer: >60 mL/min (ref >60)
Glucose, Bld: 81 mg/dL (ref 70–99)
Potassium: 3.3 mmol/L — ABNORMAL LOW (ref 3.5–5.1)
Sodium: 136 mmol/L (ref 135–145)

## 2019-03-27 LAB — CBC
HCT: 35.3 % — ABNORMAL LOW (ref 39.0–52.0)
Hemoglobin: 11.9 g/dL — ABNORMAL LOW (ref 13.0–17.0)
MCH: 30.5 pg (ref 26.0–34.0)
MCHC: 33.7 g/dL (ref 30.0–36.0)
MCV: 90.5 fL (ref 80.0–100.0)
Platelets: 260 10*3/uL (ref 150–400)
RBC: 3.9 MIL/uL — ABNORMAL LOW (ref 4.22–5.81)
RDW: 13.8 % (ref 11.5–15.5)
WBC: 7.5 10*3/uL (ref 4.0–10.5)
nRBC: 0 % (ref 0.0–0.2)

## 2019-03-27 LAB — PREALBUMIN: Prealbumin: 13.8 mg/dL — ABNORMAL LOW (ref 18–38)

## 2019-03-27 LAB — MAGNESIUM: Magnesium: 2.1 mg/dL (ref 1.7–2.4)

## 2019-03-27 LAB — PROTIME-INR
INR: 1.1 (ref 0.8–1.2)
Prothrombin Time: 13.6 seconds (ref 11.4–15.2)

## 2019-03-27 LAB — APTT: aPTT: 41 seconds — ABNORMAL HIGH (ref 24–36)

## 2019-03-27 MED ORDER — POTASSIUM CHLORIDE 10 MEQ/100ML IV SOLN
10.0000 meq | INTRAVENOUS | Status: AC
  Administered 2019-03-27 (×4): 10 meq via INTRAVENOUS
  Filled 2019-03-27 (×3): qty 100

## 2019-03-27 NOTE — Progress Notes (Signed)
PROGRESS NOTE    Eric Moody  ACZ:[id number] DOB: 09-27-1950 DOA: 03/23/2019 PCP: Lavone Orn, MD    Brief Narrative  69 y.o.malewith medical history significant ofCrohn's ileitis with history of terminal ileal stricture and ulceration admitted with worsening abdominal pain and nausea and vomiting.  He is found to have partial small bowel obstruction due to Crohn's disease with stricture.   Assessment & Plan:   Active Problems:   SBO (small bowel obstruction) (HCC)   #1 partial small bowel obstructionin the setting of Crohn's disease with stricture-.-Continue n.p.o NG tube and IV fluids.Colonoscopy11/2020 showed severe stenosis and ulceration at the terminal ileum unable to advance the scope with multiple tubular adenomas. Was on Humira and azathioprine as an outpatient which is on hold at this time. GI and general surgery has been consulted and they have been talking about what would be the next best option for him Remicade versus surgery. Await final decision from general surgery.  MR enteroabdomen with and without contrast 03/26/2019-Abnormal wall thickening and enhancement involving the terminal 30 cm of the small bowel extending to the cecum, favoring active Crohn's disease. Along the proximal margin of this involvement, there is a 3.2 cm segment of luminal narrowing and adjacent mesenteric stranding/enhancement, suspicious for inflammatory stricture, demarcating atria transition between dilated loops of small bowel and the distal small bowel segment inflamed.  1.6 cm fluid signal intensity along the wall of the terminal ileum, equivocal for intramural abscess versus intraluminal fluid, although taking into account the prior CT appearance I favor the latter.  Small amount of pelvic ascites. Previously seen small basilar pulmonary nodules are not conspicuous on today's exam.. Small nonobstructive right mid kidney calculus.. Lumbar spondylosis and degenerative disc  disease.   #2 type 2 diabetes with hyperglycemia patient on Amaryl at home which I will hold put him on SSI CBG (last 3)  Recent Labs    03/27/19 0355 03/27/19 0746 03/27/19 1132  GLUCAP 80 86 104*   #3 mild hypokalemia K of 3.4 replete check magnesium   Nutrition Problem: Inadequate oral intake Etiology: inability to eat, acute illness(SBO)     Signs/Symptoms: NPO status    Interventions: Refer to RD note for recommendations  Estimated body mass index is 21.71 kg/m as calculated from the following:   Height as of this encounter: 5' 9"  (1.753 m).   Weight as of this encounter: 66.7 kg.  DVT prophylaxis: lovenox Code Status: full Family Communication: none Disposition Plan: Patient came from home plan is for him to go back home once small bowel obstruction is resolved. Await further decision from general surgery  Consultants: gi surgery  Procedures: ngt 2/26 Antimicrobials:none   Subjective: Patient resting in bed he is very frustrated about his situation.  He would like to have surgery done as soon as possible to get rid of the problem and get the NG tube out.  Objective: Vitals:   03/26/19 0530 03/26/19 1309 03/26/19 2036 03/27/19 0532  BP: (!) 143/87 (!) 136/91 (!) 146/104 (!) 127/92  Pulse: 80 90 98 84  Resp: 17  19 17   Temp: 98.3 F (36.8 C) 97.8 F (36.6 C) 98.2 F (36.8 C) 98 F (36.7 C)  TempSrc: Oral Oral  Oral  SpO2: 97% 95% 97% 97%  Weight:      Height:        Intake/Output Summary (Last 24 hours) at 03/27/2019 1225 Last data filed at 03/27/2019 0834 Gross per 24 hour  Intake 0 ml  Output 3025 ml  Net -3025 ml   Filed Weights   03/23/19 1156  Weight: 66.7 kg    Examination:  General exam: Appears calm and comfortable  Respiratory system: Clear to auscultation. Respiratory effort normal. Cardiovascular system: S1 & S2 heard, RRR. No JVD, murmurs, rubs, gallops or clicks. No pedal edema. Gastrointestinal system: Abdomen is  nondistended, soft and mild tender. No organomegaly or masses felt.  bowel sounds heard. Central nervous system: Alert and oriented. No focal neurological deficits. Extremities: Symmetric 5 x 5 power. Skin: No rashes, lesions or ulcers Psychiatry: Judgement and insight appear normal. Mood & affect appropriate.     Data Reviewed: I have personally reviewed following labs and imaging studies  CBC: Recent Labs  Lab 03/23/19 1230 03/24/19 0600 03/26/19 1321 03/27/19 0515  WBC 8.8 6.0 6.7 7.5  HGB 13.4 11.6* 12.3* 11.9*  HCT 39.7 35.4* 36.2* 35.3*  MCV 91.3 91.9 89.4 90.5  PLT 300 268 266 174   Basic Metabolic Panel: Recent Labs  Lab 03/23/19 1230 03/24/19 0600 03/26/19 1321 03/27/19 0515  NA 134* 136 136 136  K 4.0 3.6 3.4* 3.3*  CL 100 104 104 101  CO2 23 25 22 22   GLUCOSE 263* 95 121* 81  BUN 29* 16 10 11   CREATININE 1.11 0.89 0.86 0.87  CALCIUM 8.7* 8.2* 8.3* 8.4*   GFR: Estimated Creatinine Clearance: 76.7 mL/min (by C-G formula based on SCr of 0.87 mg/dL). Liver Function Tests: Recent Labs  Lab 03/23/19 1230 03/24/19 0600 03/26/19 1321  AST 17 15 15   ALT 14 14 11   ALKPHOS 55 45 51  BILITOT 1.6* 1.3* 1.3*  PROT 6.9 6.2* 6.7  ALBUMIN 3.8 3.2* 3.4*   Recent Labs  Lab 03/23/19 1230  LIPASE 20   No results for input(s): AMMONIA in the last 168 hours. Coagulation Profile: Recent Labs  Lab 03/27/19 0515  INR 1.1   Cardiac Enzymes: No results for input(s): CKTOTAL, CKMB, CKMBINDEX, TROPONINI in the last 168 hours. BNP (last 3 results) No results for input(s): PROBNP in the last 8760 hours. HbA1C: No results for input(s): HGBA1C in the last 72 hours. CBG: Recent Labs  Lab 03/26/19 2026 03/26/19 2354 03/27/19 0355 03/27/19 0746 03/27/19 1132  GLUCAP 137* 95 80 86 104*   Lipid Profile: No results for input(s): CHOL, HDL, LDLCALC, TRIG, CHOLHDL, LDLDIRECT in the last 72 hours. Thyroid Function Tests: No results for input(s): TSH, T4TOTAL,  FREET4, T3FREE, THYROIDAB in the last 72 hours. Anemia Panel: No results for input(s): VITAMINB12, FOLATE, FERRITIN, TIBC, IRON, RETICCTPCT in the last 72 hours. Sepsis Labs: No results for input(s): PROCALCITON, LATICACIDVEN in the last 168 hours.  Recent Results (from the past 240 hour(s))  Respiratory Panel by RT PCR (Flu A&B, Covid) - Nasopharyngeal Swab     Status: None   Collection Time: 03/23/19  4:55 PM   Specimen: Nasopharyngeal Swab  Result Value Ref Range Status   SARS Coronavirus 2 by RT PCR NEGATIVE NEGATIVE Final    Comment: (NOTE) SARS-CoV-2 target nucleic acids are NOT DETECTED. The SARS-CoV-2 RNA is generally detectable in upper respiratoy specimens during the acute phase of infection. The lowest concentration of SARS-CoV-2 viral copies this assay can detect is 131 copies/mL. A negative result does not preclude SARS-Cov-2 infection and should not be used as the sole basis for treatment or other patient management decisions. A negative result may occur with  improper specimen collection/handling, submission of specimen other than nasopharyngeal swab, presence of viral mutation(s) within the areas targeted by  this assay, and inadequate number of viral copies (<131 copies/mL). A negative result must be combined with clinical observations, patient history, and epidemiological information. The expected result is Negative. Fact Sheet for Patients:  PinkCheek.be Fact Sheet for Healthcare Providers:  GravelBags.it This test is not yet ap proved or cleared by the [place] FDA and  has been authorized for detection and/or diagnosis of SARS-CoV-2 by FDA under an Emergency Use Authorization (EUA). This EUA will remain  in effect (meaning this test can be used) for the duration of the COVID-19 declaration under Section 564(b)(1) of the Act, 21 U.S.C. section 360bbb-3(b)(1), unless the authorization is terminated  or revoked sooner.    Influenza A by PCR NEGATIVE NEGATIVE Final   Influenza B by PCR NEGATIVE NEGATIVE Final    Comment: (NOTE) The Xpert Xpress SARS-CoV-2/FLU/RSV assay is intended as an aid in  the diagnosis of influenza from Nasopharyngeal swab specimens and  should not be used as a sole basis for treatment. Nasal washings and  aspirates are unacceptable for Xpert Xpress SARS-CoV-2/FLU/RSV  testing. Fact Sheet for Patients: PinkCheek.be Fact Sheet for Healthcare Providers: GravelBags.it This test is not yet approved or cleared by the [place] FDA and  has been authorized for detection and/or diagnosis of SARS-CoV-2 by  FDA under an Emergency Use Authorization (EUA). This EUA will remain  in effect (meaning this test can be used) for the duration of the  Covid-19 declaration under Section 564(b)(1) of the Act, 21  U.S.C. section 360bbb-3(b)(1), unless the authorization is  terminated or revoked. Performed at [facility], [place] [address], [place], [place] [address]          Radiology Studies: MR PELVIS W WO CONTRAST  Result Date: 03/27/2019 : Please see accession #[id number] for MRI abdomen and pelvis complete report. Electronically Signed   By: Van Clines M.D.   On: 03/27/2019 08:47   MR ENTERO ABDOMEN W WO CONTRAST  Result Date: 03/27/2019 CLINICAL DATA:  Inflammatory bowel disease with new small bowel dilatation favoring distal small bowel obstruction. EXAM: MR ABDOMEN AND PELVIS WITHOUT AND WITH CONTRAST (MR ENTEROGRAPHY) TECHNIQUE: Multiplanar, multisequence MRI of the abdomen and pelvis was performed both before and during bolus administration of intravenous contrast. Negative oral contrast VoLumen was given. CONTRAST:  60m GADAVIST GADOBUTROL 1 MMOL/ML IV SOLN COMPARISON:  CT angiogram dated 03/23/2019 FINDINGS: COMBINED FINDINGS FOR BOTH MR ABDOMEN AND PELVIS Lower chest: The small  basilar pulmonary nodules are not conspicuous on today's MRI. Hepatobiliary: Unremarkable Pancreas:  Unremarkable Spleen:  Unremarkable Adrenals/Urinary Tract: The patient has a known small nonobstructive right mid kidney calculus shown on 03/23/2019 which is less well seen by MRI. 1.2 cm Bosniak category 1 cyst of the left kidney lower pole. No hydronephrosis. Adrenal glands normal. Urinary bladder unremarkable. Stomach/Bowel: There is abnormal wall thickening and luminal narrowing with associated abnormal accentuated mucosal enhancement in the arterial phase persisting into the 90 second images involving the terminal 30 cm of the small bowel extending to the cecum, favoring transmural inflammation. Small focus of fat necrosis or localized inflammation of a fatty lobule along the anterior margin of the terminal ileum shown on images 14-15 of series 4, versus less likely a small diverticulum. On image 30/24, there is a focal 1.6 cm fluid collection with less in the way of surrounding mucosal inflammation, raising the possibility of an intramural fluid collection rather than a luminal fluid collection, such that a small intramural abscess is not excluded. However, the appearance  was less compelling for abscess and more suggestive of intraluminal fluid on 03/23/2019. Proximal to the inflamed loops of distal small bowel, there is dilatation of the small bowel up to about 5.3 cm in diameter. At the interface between the dilated and nondilated small bowel, there is a 3.2 cm segment of luminal narrowing and luminal inflammation with adjacent mesenteric stranding/inflammation, suspicious for potential inflammatory stricture, as shown on image 37/24. No additional areas of specific bowel involvement are identified. No compelling evidence of colitis. There are some scattered small diverticula of the sigmoid colon. Vascular/Lymphatic: Mild abdominal aortic atherosclerotic vascular disease. No adenopathy identified.  Reproductive: Not well characterized today due to boundary field defects. Other:  Small amount of pelvic ascites. Musculoskeletal: Partially segmental S1 vertebra. There is a suggestion of lower lumbar spondylosis and degenerative disc disease. No compelling inflammatory findings along the SI joints. IMPRESSION: 1. Abnormal wall thickening and enhancement involving the terminal 30 cm of the small bowel extending to the cecum, favoring active Crohn's disease. Along the proximal margin of this involvement, there is a 3.2 cm segment of luminal narrowing and adjacent mesenteric stranding/enhancement, suspicious for inflammatory stricture, demarcating atria transition between dilated loops of small bowel and the distal small bowel segment inflamed. 2. 1.6 cm fluid signal intensity along the wall of the terminal ileum, equivocal for intramural abscess versus intraluminal fluid, although taking into account the prior CT appearance I favor the latter. 3. Small amount of pelvic ascites. 4. Previously seen small basilar pulmonary nodules are not conspicuous on today's exam. 5. Small nonobstructive right mid kidney calculus. 6. Lumbar spondylosis and degenerative disc disease. Electronically Signed   By: Van Clines M.D.   On: 03/27/2019 08:43        Scheduled Meds: . enoxaparin (LOVENOX) injection  40 mg Subcutaneous Q24H  . insulin aspart  0-6 Units Subcutaneous TID WC   Continuous Infusions: . sodium chloride 100 mL/hr at 03/27/19 1214     LOS: 3 days    Georgette Shell, MD  03/27/2019, 12:25 PM

## 2019-03-27 NOTE — Progress Notes (Signed)
Eric Moody 11:26 AM  Subjective: Patient is frustrated and we had a long talk about medicine options his MRI finding and he still feels like this will just be a delay and surgery is inevitable and I talked to his primary gastroenterologist Dr. Alessandra Bevels as well as his surgeon Dr. Harlow Asa and we discussed IV steroids and possibly Remicade and he was on Remicade 4 years ago and there is a question of whether it was stopped for insurance reasons or whether it stopped working and he has been on Imuran for about 4 months and multiple prednisone tapers and we discussed some of the other Biologics and answered all of his questions Objective: Vital signs stable afebrile not in very good spirits but no acute distress abdomen is soft occasional bowel sound minimal lower discomfort only no guarding or rebound NG output still moderate labs okay MRI reviewed  Assessment: Crohn's disease  Plan: Will await surgical discussion later today but if no surgery is planned would recommend IV Solu-Medrol 60 mg twice a day and Dr. Alessandra Bevels feels that Remicade would still be his next best option even though he was on it before but will wait on above conversation to decide how to proceed  [facility]  office [phone] After 5PM or if no answer call [phone]

## 2019-03-27 NOTE — Progress Notes (Signed)
CC: Abdominal pain/Crohn's disease  Subjective: He is pretty upset that he is not having surgery today.  No real change in his physical exam.  Ongoing NG drainage.  Objective: Vital signs in last 24 hours: Temp:  [97.8 F (36.6 C)-98.2 F (36.8 C)] 98 F (36.7 C) (03/02 0532) Pulse Rate:  [84-98] 84 (03/02 0532) Resp:  [17-19] 17 (03/02 0532) BP: (127-146)/(91-104) 127/92 (03/02 0532) SpO2:  [95 %-97 %] 97 % (03/02 0532) Last BM Date: 03/23/19 N.p.o. No IVs recorded . sodium chloride 100 mL/hr at 03/26/19 2310  Urine 925 NG 1550 Afebrile blood pressures elevated especially the diastolic Potassium 3.3 Prealbumin 13.8 WBC 7.5 H/H 0.9/35.3 MRI enterography completed this a.m.: Abdominal wall thickening and enhancement terminal 30 cm of small bowel extending to the cecum, favoring active Crohn's disease along the proximal margin of this involvement there is a 3.2 cm luminal narrowing and adjacent mesenteric stranding/enhancement suspicious for inflammatory stricture, demarcating a transition between the dilated loops of small bowel and the distal small bowel inflamed segment.  There is also a 1.6 cm signal intensity along the wall of the terminal ileum, equivocal for intramural abscess versus intraluminal fluid.   Intake/Output from previous day: 03/01 0701 - 03/02 0700 In: 0  Out: 2475 [Urine:925; Emesis/NG output:1550] Intake/Output this shift: Total I/O In: -  Out: 1000 [Urine:200; Emesis/NG output:800]  General appearance: alert, cooperative and no distress Resp: clear to auscultation bilaterally GI: Abdomen soft minimally distended.  NG drainage is dark green 1-1/2 L yesterday.  No real change from prior exam.  Lab Results:  Recent Labs    03/26/19 1321 03/27/19 0515  WBC 6.7 7.5  HGB 12.3* 11.9*  HCT 36.2* 35.3*  PLT 266 260    BMET Recent Labs    03/26/19 1321 03/27/19 0515  NA 136 136  K 3.4* 3.3*  CL 104 101  CO2 22 22  GLUCOSE 121* 81  BUN  10 11  CREATININE 0.86 0.87  CALCIUM 8.3* 8.4*   PT/INR Recent Labs    03/27/19 0515  LABPROT 13.6  INR 1.1    Recent Labs  Lab 03/23/19 1230 03/24/19 0600 03/26/19 1321  AST 17 15 15   ALT 14 14 11   ALKPHOS 55 45 51  BILITOT 1.6* 1.3* 1.3*  PROT 6.9 6.2* 6.7  ALBUMIN 3.8 3.2* 3.4*     Lipase     Component Value Date/Time   LIPASE 20 03/23/2019 1230     Medications: . enoxaparin (LOVENOX) injection  40 mg Subcutaneous Q24H  . insulin aspart  0-6 Units Subcutaneous TID WC    Assessment/Plan HTN DM GERD Moderate protein calorie malnutrition -prealbumin is 13.8.  Crohns -currently on Humiraand azathioprine - last colonoscopy 11/2018 showedmultiple tubular adenomas as well as severe stenosis and ulceration at terminal ileum/ unableto advance the scope  SBO, possibly secondary to stricture  - admit 2/26 -secondary to distal SB stricture  Plan OR next 1- 2 days per Dr Harlow Asa at this point  D/W Gerkin2/28/21 More than likely will benefit from SB resection  MR enterography 3/2: Abnormal thickening and enhancement terminal 30 cm small bowel extending to the cecum, favoring active Crohn's.  3.2 cm luminal narrowing adjacent mesenteric stranding suspicious for inflammatory stricture.  1.6 cm fluid equivocal for intramural abscess versus intraluminal fluid.   ID -none VTE -SCDs, lovenox FEN -IVF, NPO/NGT to LIWS Foley -none Follow up -TBD   Plan: Continue NG drainage, await GI recommendations.  LOS: 3 days  Eric Moody 03/27/2019 Please see Amion

## 2019-03-28 DIAGNOSIS — K56609 Unspecified intestinal obstruction, unspecified as to partial versus complete obstruction: Secondary | ICD-10-CM

## 2019-03-28 LAB — BASIC METABOLIC PANEL
Anion gap: 11 (ref 5–15)
BUN: 11 mg/dL (ref 8–23)
CO2: 19 mmol/L — ABNORMAL LOW (ref 22–32)
Calcium: 8.3 mg/dL — ABNORMAL LOW (ref 8.9–10.3)
Chloride: 106 mmol/L (ref 98–111)
Creatinine, Ser: 0.8 mg/dL (ref 0.61–1.24)
GFR calc Af Amer: >60 mL/min (ref >60)
GFR calc non Af Amer: >60 mL/min (ref >60)
Glucose, Bld: 85 mg/dL (ref 70–99)
Potassium: 3.8 mmol/L (ref 3.5–5.1)
Sodium: 136 mmol/L (ref 135–145)

## 2019-03-28 LAB — MAGNESIUM: Magnesium: 2.2 mg/dL (ref 1.7–2.4)

## 2019-03-28 LAB — GLUCOSE, CAPILLARY
Glucose-Capillary: 107 mg/dL — ABNORMAL HIGH (ref 70–99)
Glucose-Capillary: 109 mg/dL — ABNORMAL HIGH (ref 70–99)
Glucose-Capillary: 175 mg/dL — ABNORMAL HIGH (ref 70–99)
Glucose-Capillary: 190 mg/dL — ABNORMAL HIGH (ref 70–99)
Glucose-Capillary: 202 mg/dL — ABNORMAL HIGH (ref 70–99)
Glucose-Capillary: 212 mg/dL — ABNORMAL HIGH (ref 70–99)
Glucose-Capillary: 62 mg/dL — ABNORMAL LOW (ref 70–99)
Glucose-Capillary: 89 mg/dL (ref 70–99)

## 2019-03-28 MED ORDER — DEXTROSE 50 % IV SOLN
INTRAVENOUS | Status: AC
  Administered 2019-03-28: 50 mL
  Filled 2019-03-28: qty 50

## 2019-03-28 MED ORDER — DEXTROSE IN LACTATED RINGERS 5 % IV SOLN: INTRAVENOUS | Status: DC

## 2019-03-28 MED ORDER — METHYLPREDNISOLONE SODIUM SUCC 125 MG IJ SOLR: 60.0000 mg | Freq: Two times a day (BID) | INTRAMUSCULAR | Status: DC

## 2019-03-28 MED ORDER — METHYLPREDNISOLONE SODIUM SUCC 125 MG IJ SOLR
60.0000 mg | Freq: Three times a day (TID) | INTRAMUSCULAR | Status: DC
  Administered 2019-03-28 – 2019-03-30 (×7): 60 mg via INTRAVENOUS
  Filled 2019-03-28 (×7): qty 2

## 2019-03-28 MED ORDER — ADALIMUMAB 40 MG/0.4ML ~~LOC~~ PSKT
40.0000 mg | PREFILLED_SYRINGE | SUBCUTANEOUS | Status: DC
  Administered 2019-03-29: 40 mg via SUBCUTANEOUS

## 2019-03-28 NOTE — Progress Notes (Signed)
-  Call patient at his request today.  Around 12 minutes spent over the phone.  -He is somewhat upset because his surgery is currently on hold.  We did discuss that MRI enterography showed active Crohn's disease.  Postoperative complications/ risks such as abscess, fistula formation and nonhealing of anastomosis in presence of active Crohn's disease discussed.  -Patient is now in agreement to maximize his medical management with increasing Humira to every week.  Continue Solu-Medrol.  Continue Imuran.  If no improvement with current management, switch to Remicade.  -We will also arrange for second opinion at [facility] once he is discharged.  -Patient verbalized understanding.  Otis Brace MD, [place] 03/28/2019, 12:41 PM  Contact #  [phone]

## 2019-03-28 NOTE — Progress Notes (Addendum)
PROGRESS NOTE  Eric Moody EQA:[id number] DOB: [birth year] DOA: 03/23/2019 PCP: Lavone Orn, MD  HPI/Recap of past 24 hours: 69 y.o.malewith medical history significant ofCrohn's disease with terminal ileal stricture and ulceration admitted with worsening abdominal pain, nausea and vomiting.  Found to have partial small bowel obstruction due to Crohn's disease with stricture.  General surgery and GI are following.  NG tube in place to suction.  03/28/19: Seen and examined.  Frustrated about being in the hospital for 6 days and not receiving crohn's specific therapies.  Reached out to [facility].  Started IV solumedrol 60 mg BID.  Defer rest of GI management to GI.  Assessment/Plan: Active Problems:   SBO (small bowel obstruction) (HCC)  Partial small bowel obstructionin the setting of Crohn's diseasewith stricture-.-Continue n.p.o NG tube and IV fluids.Colonoscopy11/2020 showed severe stenosis and ulceration at the terminal ileum unable to advance the scope with multiple tubular adenomas. Was on Humira and azathioprine as an outpatient which is on hold at this time. GI and general surgery following Reached out to [facility] Started on IV solumedrol 60 mg BID Defer to GI to start Remicade  Mobilize as tolerated, monitor and replete electrolytes as needed  MR enteroabdomen with and without contrast 03/26/2019-Abnormal wall thickening and enhancement involving the terminal 30 cm of the small bowel extending to the cecum, favoring active Crohn's disease. Along the proximal margin of this involvement, there is a 3.2 cm segment of luminal narrowing and adjacent mesenteric stranding/enhancement, suspicious for inflammatory stricture, demarcating atria transition between dilated loops of small bowel and the distal small bowel segment inflamed.  1.6 cm fluid signal intensity along the wall of the terminal ileum, equivocal for intramural abscess versus intraluminal fluid, although taking into  account the prior CT appearance I favor the latter.  Small amount of pelvic ascites. Previously seen small basilar pulmonary nodules are not conspicuous on today's exam.. Small nonobstructive right mid kidney calculus.. Lumbar spondylosis and degenerative disc disease.   #2 type 2 diabetes with hypoglycemia CBG 62 3/3. Tightly controlled on glimeperide at home.  Hold off po antiglycemic.  A1C 6.2.  Has not required insulin. Will change IV fluid to D5LR since NPO.  #3 Resolved post repletion mild hypokalemia K of 3.4 >> 3.8 Goal K+ >4.0 and mag >2.0  #4 Acute metabolic acidosis Chemistry bicarb 19 AG 11 C/w IV fluid and repeat labs am    Estimated body mass index is 21.71 kg/m as calculated from the following:   Height as of this encounter: 5' 9"  (1.753 m).   Weight as of this encounter: 66.7 kg.  DVT prophylaxis:lovenox sq daily Code Status:full Family Communication:none Disposition Plan:Patient came from home plan is for him to go back home once small bowel obstruction is resolved. Await further decision from GI  Consultants:GI, surgery  Procedures:ngt 2/26 Antimicrobials:none      Objective: Vitals:   03/27/19 0532 03/27/19 1353 03/27/19 2034 03/28/19 0438  BP: (!) 127/92 (!) 135/95 (!) 137/92 (!) 150/91  Pulse: 84 94 90 86  Resp: 17 18 18 16   Temp: 98 F (36.7 C) 99 F (37.2 C) 98.4 F (36.9 C) 98.1 F (36.7 C)  TempSrc: Oral Oral    SpO2: 97% 97% 97% 95%  Weight:      Height:        Intake/Output Summary (Last 24 hours) at 03/28/2019 1108 Last data filed at 03/28/2019 1101 Gross per 24 hour  Intake 4437.91 ml  Output 3180 ml  Net 1257.W[place]h  ml   Filed Weights   03/23/19 1156  Weight: 66.7 kg    Exam:  . General: 69 y.o. year-old male well developed well nourished in no acute distress.  Alert and oriented x3. . Cardiovascular: Regular rate and rhythm with no rubs or gallops.  No thyromegaly or JVD noted.   Marland Kitchen Respiratory: Clear to  auscultation with no wheezes or rales. Good inspiratory effort. . Abdomen: Soft hypoactive bowel sounds.  . Musculoskeletal: No lower extremity edema bilaterally . Psychiatry: Mood is appropriate for condition and setting   Data Reviewed: CBC: Recent Labs  Lab 03/23/19 1230 03/24/19 0600 03/26/19 1321 03/27/19 0515  WBC 8.8 6.0 6.7 7.5  HGB 13.4 11.6* 12.3* 11.9*  HCT 39.7 35.4* 36.2* 35.3*  MCV 91.3 91.9 89.4 90.5  PLT 300 268 266 481   Basic Metabolic Panel: Recent Labs  Lab 03/23/19 1230 03/24/19 0600 03/26/19 1321 03/27/19 0515 03/28/19 0537  NA 134* 136 136 136 136  K 4.0 3.6 3.4* 3.3* 3.8  CL 100 104 104 101 106  CO2 23 25 22 22  19*  GLUCOSE 263* 95 121* 81 85  BUN 29* 16 10 11 11   CREATININE 1.11 0.89 0.86 0.87 0.80  CALCIUM 8.7* 8.2* 8.3* 8.4* 8.3*  MG  --   --   --  2.1 2.2   GFR: Estimated Creatinine Clearance: 83.4 mL/min (by C-G formula based on SCr of 0.8 mg/dL). Liver Function Tests: Recent Labs  Lab 03/23/19 1230 03/24/19 0600 03/26/19 1321  AST 17 15 15   ALT 14 14 11   ALKPHOS 55 45 51  BILITOT 1.6* 1.3* 1.3*  PROT 6.9 6.2* 6.7  ALBUMIN 3.8 3.2* 3.4*   Recent Labs  Lab 03/23/19 1230  LIPASE 20   No results for input(s): AMMONIA in the last 168 hours. Coagulation Profile: Recent Labs  Lab 03/27/19 0515  INR 1.1   Cardiac Enzymes: No results for input(s): CKTOTAL, CKMB, CKMBINDEX, TROPONINI in the last 168 hours. BNP (last 3 results) No results for input(s): PROBNP in the last 8760 hours. HbA1C: No results for input(s): HGBA1C in the last 72 hours. CBG: Recent Labs  Lab 03/27/19 2036 03/28/19 0010 03/28/19 0058 03/28/19 0440 03/28/19 0742  GLUCAP 83 62* 190* 89 109*   Lipid Profile: No results for input(s): CHOL, HDL, LDLCALC, TRIG, CHOLHDL, LDLDIRECT in the last 72 hours. Thyroid Function Tests: No results for input(s): TSH, T4TOTAL, FREET4, T3FREE, THYROIDAB in the last 72 hours. Anemia Panel: No results for  input(s): VITAMINB12, FOLATE, FERRITIN, TIBC, IRON, RETICCTPCT in the last 72 hours. Urine analysis:    Component Value Date/Time   COLORURINE YELLOW 03/23/2019 1513   APPEARANCEUR CLEAR 03/23/2019 1513   LABSPEC 1.026 03/23/2019 1513   PHURINE 5.0 03/23/2019 1513   GLUCOSEU 150 (A) 03/23/2019 1513   HGBUR NEGATIVE 03/23/2019 1513   BILIRUBINUR NEGATIVE 03/23/2019 1513   KETONESUR 5 (A) 03/23/2019 1513   PROTEINUR NEGATIVE 03/23/2019 1513   NITRITE NEGATIVE 03/23/2019 1513   LEUKOCYTESUR NEGATIVE 03/23/2019 1513   Sepsis Labs: @LABRCNTIP (procalcitonin:4,lacticidven:4)  ) Recent Results (from the past 240 hour(s))  Respiratory Panel by RT PCR (Flu A&B, Covid) - Nasopharyngeal Swab     Status: None   Collection Time: 03/23/19  4:55 PM   Specimen: Nasopharyngeal Swab  Result Value Ref Range Status   SARS Coronavirus 2 by RT PCR NEGATIVE NEGATIVE Final    Comment: (NOTE) SARS-CoV-2 target nucleic acids are NOT DETECTED. The SARS-CoV-2 RNA is generally detectable in upper respiratoy specimens  during the acute phase of infection. The lowest concentration of SARS-CoV-2 viral copies this assay can detect is 131 copies/mL. A negative result does not preclude SARS-Cov-2 infection and should not be used as the sole basis for treatment or other patient management decisions. A negative result may occur with  improper specimen collection/handling, submission of specimen other than nasopharyngeal swab, presence of viral mutation(s) within the areas targeted by this assay, and inadequate number of viral copies (<131 copies/mL). A negative result must be combined with clinical observations, patient history, and epidemiological information. The expected result is Negative. Fact Sheet for Patients:  PinkCheek.be Fact Sheet for Healthcare Providers:  GravelBags.it This test is not yet ap proved or cleared by the [place] FDA and    has been authorized for detection and/or diagnosis of SARS-CoV-2 by FDA under an Emergency Use Authorization (EUA). This EUA will remain  in effect (meaning this test can be used) for the duration of the COVID-19 declaration under Section 564(b)(1) of the Act, 21 U.S.C. section 360bbb-3(b)(1), unless the authorization is terminated or revoked sooner.    Influenza A by PCR NEGATIVE NEGATIVE Final   Influenza B by PCR NEGATIVE NEGATIVE Final    Comment: (NOTE) The Xpert Xpress SARS-CoV-2/FLU/RSV assay is intended as an aid in  the diagnosis of influenza from Nasopharyngeal swab specimens and  should not be used as a sole basis for treatment. Nasal washings and  aspirates are unacceptable for Xpert Xpress SARS-CoV-2/FLU/RSV  testing. Fact Sheet for Patients: PinkCheek.be Fact Sheet for Healthcare Providers: GravelBags.it This test is not yet approved or cleared by the [place] FDA and  has been authorized for detection and/or diagnosis of SARS-CoV-2 by  FDA under an Emergency Use Authorization (EUA). This EUA will remain  in effect (meaning this test can be used) for the duration of the  Covid-19 declaration under Section 564(b)(1) of the Act, 21  U.S.C. section 360bbb-3(b)(1), unless the authorization is  terminated or revoked. Performed at [facility], [place] [address], [place], [place] [address]       Studies: No results found.  Scheduled Meds: . enoxaparin (LOVENOX) injection  40 mg Subcutaneous Q24H  . insulin aspart  0-6 Units Subcutaneous TID WC  . methylPREDNISolone (SOLU-MEDROL) injection  60 mg Intravenous Q8H    Continuous Infusions: . sodium chloride 100 mL/hr at 03/28/19 0934     LOS: 4 days     Kayleen Memos, MD [facility] Pager [phone]  If 7PM-7AM, please contact night-coverage [email or web address] Password [email or web address] 03/28/2019, 11:08 AM

## 2019-03-28 NOTE — Progress Notes (Signed)
Hypoglycemic Event  CBG: 62 at 0010  Treatment: Given 1 amp/46m of Dextrose 50 IV injection at 0032  Symptoms: Patient asymptomatic.  Follow-up CBG: Time: 01146CBG Result: 190  Possible Reasons for Event: Patient is NPO.  Comments/MD notified: On call provider made aware via [place] No new orders at this time.  Patient remains asymptomatic. RN will continue to monitor patient. Call bell within reach and bed in lowest position.    [place]

## 2019-03-28 NOTE — Progress Notes (Signed)
Dr. Alessandra Bevels is recommending steroids and Remicade.  We will be available as needed if there is a change in the treatment plan.  Please call if we can assist.

## 2019-03-28 NOTE — Care Management Important Message (Signed)
Important Message  Patient Details IM Letter given to Pernell Dupre SW Case Manager to present to the Patient Name: Eric Moody MRN: [id number] Date of Birth: 13-Jul-1950   Medicare Important Message Given:  Yes     Kerin Salen 03/28/2019, 11:03 AM

## 2019-03-28 NOTE — Progress Notes (Signed)
Eric Moody 1:58 PM  Subjective: Patient and I again had a long conversation about medicines and the options and he agrees to proceed with steroids but prefers to resume Humira every week and will restart Imuran when he is able to eat and his case discussed with his primary gastroenterologist as well as the hospital team he continues to have moderate NG output is passing gas from below  Objective: Vital signs stable afebrile no acute distress abdomen is soft decreased tenderness from yesterday occasional bowel sounds chemistries okay  Assessment: Crohn's disease  Plan: We will begin IV steroids and okay with me to take his home dose of Humira tomorrow his girlfriend will bring him to the hospital needs to order it I believe it is 40 mg and his Imuran dose is 150 mg and I will check on tomorrow  [facility] E  office [phone] After 5PM or if no answer call [phone]

## 2019-03-28 NOTE — Progress Notes (Signed)
Discussed with GI, Dr. Watt Climes:  Restart home Humira, continue IV solumedrol 60 mg BID.  Pharmacy consulted to use his own Humira from home.  Ok to restart Imuran possibly tomorrow once can take po.  Highly appreciate GI's assistance with the management of the care of this patient.

## 2019-03-29 LAB — QUANTIFERON-TB GOLD PLUS (RQFGPL)
QuantiFERON Mitogen Value: 6.81 IU/mL
QuantiFERON Nil Value: 0.05 IU/mL
QuantiFERON TB1 Ag Value: 0.05 IU/mL
QuantiFERON TB2 Ag Value: 0.04 IU/mL

## 2019-03-29 LAB — BASIC METABOLIC PANEL
Anion gap: 13 (ref 5–15)
BUN: 12 mg/dL (ref 8–23)
CO2: 16 mmol/L — ABNORMAL LOW (ref 22–32)
Calcium: 8.5 mg/dL — ABNORMAL LOW (ref 8.9–10.3)
Chloride: 105 mmol/L (ref 98–111)
Creatinine, Ser: 0.86 mg/dL (ref 0.61–1.24)
GFR calc Af Amer: >60 mL/min (ref >60)
GFR calc non Af Amer: >60 mL/min (ref >60)
Glucose, Bld: 245 mg/dL — ABNORMAL HIGH (ref 70–99)
Potassium: 4.2 mmol/L (ref 3.5–5.1)
Sodium: 134 mmol/L — ABNORMAL LOW (ref 135–145)

## 2019-03-29 LAB — GLUCOSE, CAPILLARY
Glucose-Capillary: 209 mg/dL — ABNORMAL HIGH (ref 70–99)
Glucose-Capillary: 248 mg/dL — ABNORMAL HIGH (ref 70–99)
Glucose-Capillary: 318 mg/dL — ABNORMAL HIGH (ref 70–99)
Glucose-Capillary: 189 mg/dL — ABNORMAL HIGH (ref 70–99)
Glucose-Capillary: 266 mg/dL — ABNORMAL HIGH (ref 70–99)

## 2019-03-29 LAB — PHOSPHORUS: Phosphorus: 3.2 mg/dL (ref 2.5–4.6)

## 2019-03-29 LAB — QUANTIFERON-TB GOLD PLUS: QuantiFERON-TB Gold Plus: NEGATIVE

## 2019-03-29 LAB — MAGNESIUM: Magnesium: 2.2 mg/dL (ref 1.7–2.4)

## 2019-03-29 MED ORDER — INSULIN ASPART 100 UNIT/ML ~~LOC~~ SOLN
0.0000 [IU] | Freq: Three times a day (TID) | SUBCUTANEOUS | Status: DC
  Administered 2019-03-29: 2 [IU] via SUBCUTANEOUS
  Administered 2019-03-30: 3 [IU] via SUBCUTANEOUS
  Administered 2019-03-30: 7 [IU] via SUBCUTANEOUS
  Administered 2019-03-30: 5 [IU] via SUBCUTANEOUS
  Administered 2019-03-31: 1 [IU] via SUBCUTANEOUS

## 2019-03-29 MED ORDER — SODIUM BICARBONATE 8.4 % IV SOLN
50.0000 meq | Freq: Two times a day (BID) | INTRAVENOUS | Status: AC
  Administered 2019-03-29 – 2019-03-30 (×2): 50 meq via INTRAVENOUS
  Filled 2019-03-29 (×2): qty 50

## 2019-03-29 MED ORDER — INSULIN ASPART 100 UNIT/ML ~~LOC~~ SOLN
0.0000 [IU] | Freq: Every day | SUBCUTANEOUS | Status: DC
  Administered 2019-03-29: 3 [IU] via SUBCUTANEOUS
  Administered 2019-03-30: 2 [IU] via SUBCUTANEOUS

## 2019-03-29 MED ORDER — AZATHIOPRINE 50 MG PO TABS
150.0000 mg | ORAL_TABLET | Freq: Every day | ORAL | Status: DC
  Administered 2019-03-29 – 2019-03-30 (×2): 150 mg via ORAL
  Filled 2019-03-29 (×3): qty 3

## 2019-03-29 MED ORDER — LACTATED RINGERS IV SOLN: INTRAVENOUS | Status: DC

## 2019-03-29 MED ORDER — PANTOPRAZOLE SODIUM 40 MG PO TBEC
40.0000 mg | DELAYED_RELEASE_TABLET | Freq: Every day | ORAL | Status: DC
  Administered 2019-03-29 – 2019-03-30 (×2): 40 mg via ORAL
  Filled 2019-03-29 (×2): qty 1

## 2019-03-29 NOTE — Progress Notes (Signed)
-  Just spoke with the patient this morning at his request. -He is feeling much better today.  Had a formed stool this morning.  He has reviewed [facility] website and is willing to go for  consultation there for second opinion after discharge.  He also advised me to hold off on calling his daughter.  He will call his daughter and update on his progress and further management options.  Otis Brace MD, [place] 03/29/2019, 10:07 AM  Contact #  [phone]

## 2019-03-29 NOTE — Progress Notes (Signed)
Maahir DESHANNON HINCHLIFFE 8:45 AM  Subjective: Patient doing better than yesterday and again we rediscussed the plan answered all of his questions discuss Crohn's disease possible surgery etc. and he is passing some air and walking a lot and feels like he has to have a bowel movement which we also discussed at home  Objective: Vital signs stable afebrile no acute distress patient not examined today washing up in better spirits  Assessment: Crohn's disease  Plan: We will clamp NG and try for sips of clear liquids and if pain or nausea and vomiting return go back to NG suction otherwise if doing well this evening may have tray of clear liquids and if okay in a.m. can pull NG but would not advance diet until Saturday  [facility] E  office [phone] After 5PM or if no answer call [phone]

## 2019-03-29 NOTE — Progress Notes (Signed)
PROGRESS NOTE  SALOME COZBY MGQ:[id number] DOB: 16-Dec-1950 DOA: 03/23/2019 PCP: Lavone Orn, MD  HPI/Recap of past 24 hours: 69 y.o.malewith medical history significant ofCrohn's disease with terminal ileal stricture and ulceration admitted with worsening abdominal pain, nausea and vomiting.  Found to have partial small bowel obstruction due to Crohn's disease with stricture.  General surgery and GI are following.  NG tube placed. Clamped on 3/4 and started on clear liquid diet.   No acute events overnight.  Mobilizing without difficulty.  Positive flatus, + BM.  03/29/19: Feels better today.  NG tube clamped.  Started on clear liquid diet.  Will advance diet as tolerated with GI's approval.  Anticipate dc likely tomorrow if can tolerate a soft diet and GI/General surgery sign off.   Assessment/Plan: Active Problems:   SBO (small bowel obstruction) (HCC)  Resolving partial small bowel obstructionin the setting of Crohn's diseasewith stricture-. Positive flatus, positive BM. NG tube clamped on 3/4, started on clear liquid diet. Will advance diet as tolerated with GIs approval. Continue gentle IV fluid hydration Continue to maintain potassium greater than 4.0 and magnesium greater than 2.0- Colonoscopy11/2020 showed severe stenosis and ulceration at the terminal ileum unable to advance the scope with multiple tubular adenomas. Was on Humira and azathioprine as an outpatient  Humira and azathioprine resumed on 3/4.   GI and general surgery directing care.   Continue IV solumedrol 60 mg TID, started on 3/3.  Start p.o. Protonix 40 mg daily for GI prophylaxis on steroids Continue to mobilize as tolerated  Type 2 diabetes with hyperglycemia exacerbated by IV steroids CBG 62 3/3>>245 on 3/4. Hemoglobin A1c 6.2 on 03/23/2019. Insulin sliding scale.  Resolved post repletion mild hypokalemia K of 3.4 >> 3.8 Goal K+ >4.0 and mag >2.0 Potassium 4.2 and magnesium 2.2.  Acute  metabolic acidosis Chemistry bicarb 19>> 16 AG 13 2 Amps of bicarb Continue IV fluids    Estimated body mass index is 21.71 kg/m as calculated from the following:   Height as of this encounter: 5' 9"  (1.753 m).   Weight as of this encounter: 66.7 kg.  DVT prophylaxis:lovenox sq daily Code Status:full Family Communication:none Disposition Plan:Patient came from home plan is for him to go back home once small bowel obstruction is resolved. Await further decision from GI  Consultants:GI, surgery  Procedures:ngt 2/26 Antimicrobials:none      Objective: Vitals:   03/28/19 2023 03/29/19 0435 03/29/19 0837 03/29/19 1350  BP: (!) 144/98 134/90  (!) 156/87  Pulse: 93 81  81  Resp: 17 17  15   Temp: 98.2 F (36.8 C) 97.6 F (36.4 C)  97.9 F (36.6 C)  TempSrc: Oral Oral  Oral  SpO2: 98% 96%  99%  Weight:   65 kg   Height:        Intake/Output Summary (Last 24 hours) at 03/29/2019 1509 Last data filed at 03/29/2019 1100 Gross per 24 hour  Intake 1578.71 ml  Output 1195 ml  Net 383.71 ml   Filed Weights   03/23/19 1156 03/29/19 0837  Weight: 66.7 kg 65 kg    Exam:  . General: 69 y.o. year-old male well-developed well-nourished no acute distress.  Alert and oriented x3.   . Cardiovascular: Regular rate and rhythm no rubs or gallops.   Marland Kitchen Respiratory: Clear to auscultation no wheezes or rales.   . Abdomen: Soft nontender bowel sounds present. . Musculoskeletal: No lower extremity edema bilaterally.   Marland Kitchen Psychiatry: Mood is appropriate for condition and setting.  Data Reviewed: CBC: Recent Labs  Lab 03/23/19 1230 03/24/19 0600 03/26/19 1321 03/27/19 0515  WBC 8.8 6.0 6.7 7.5  HGB 13.4 11.6* 12.3* 11.9*  HCT 39.7 35.4* 36.2* 35.3*  MCV 91.3 91.9 89.4 90.5  PLT 300 268 266 599   Basic Metabolic Panel: Recent Labs  Lab 03/24/19 0600 03/26/19 1321 03/27/19 0515 03/28/19 0537 03/29/19 0629  NA 136 136 136 136 134*  K 3.6 3.4* 3.3* 3.8 4.2  CL  104 104 101 106 105  CO2 25 22 22  19* 16*  GLUCOSE 95 121* 81 85 245*  BUN 16 10 11 11 12   CREATININE 0.89 0.86 0.87 0.80 0.86  CALCIUM 8.2* 8.3* 8.4* 8.3* 8.5*  MG  --   --  2.1 2.2 2.2  PHOS  --   --   --   --  3.2   GFR: Estimated Creatinine Clearance: 75.6 mL/min (by C-G formula based on SCr of 0.86 mg/dL). Liver Function Tests: Recent Labs  Lab 03/23/19 1230 03/24/19 0600 03/26/19 1321  AST 17 15 15   ALT 14 14 11   ALKPHOS 55 45 51  BILITOT 1.6* 1.3* 1.3*  PROT 6.9 6.2* 6.7  ALBUMIN 3.8 3.2* 3.4*   Recent Labs  Lab 03/23/19 1230  LIPASE 20   No results for input(s): AMMONIA in the last 168 hours. Coagulation Profile: Recent Labs  Lab 03/27/19 0515  INR 1.1   Cardiac Enzymes: No results for input(s): CKTOTAL, CKMB, CKMBINDEX, TROPONINI in the last 168 hours. BNP (last 3 results) No results for input(s): PROBNP in the last 8760 hours. HbA1C: No results for input(s): HGBA1C in the last 72 hours. CBG: Recent Labs  Lab 03/28/19 2024 03/28/19 2347 03/29/19 0437 03/29/19 0725 03/29/19 1140  GLUCAP 202* 212* 209* 248* 318*   Lipid Profile: No results for input(s): CHOL, HDL, LDLCALC, TRIG, CHOLHDL, LDLDIRECT in the last 72 hours. Thyroid Function Tests: No results for input(s): TSH, T4TOTAL, FREET4, T3FREE, THYROIDAB in the last 72 hours. Anemia Panel: No results for input(s): VITAMINB12, FOLATE, FERRITIN, TIBC, IRON, RETICCTPCT in the last 72 hours. Urine analysis:    Component Value Date/Time   COLORURINE YELLOW 03/23/2019 1513   APPEARANCEUR CLEAR 03/23/2019 1513   LABSPEC 1.026 03/23/2019 1513   PHURINE 5.0 03/23/2019 1513   GLUCOSEU 150 (A) 03/23/2019 1513   HGBUR NEGATIVE 03/23/2019 1513   BILIRUBINUR NEGATIVE 03/23/2019 1513   KETONESUR 5 (A) 03/23/2019 1513   PROTEINUR NEGATIVE 03/23/2019 1513   NITRITE NEGATIVE 03/23/2019 1513   LEUKOCYTESUR NEGATIVE 03/23/2019 1513   Sepsis Labs: @LABRCNTIP (procalcitonin:4,lacticidven:4)  ) Recent  Results (from the past 240 hour(s))  Respiratory Panel by RT PCR (Flu A&B, Covid) - Nasopharyngeal Swab     Status: None   Collection Time: 03/23/19  4:55 PM   Specimen: Nasopharyngeal Swab  Result Value Ref Range Status   SARS Coronavirus 2 by RT PCR NEGATIVE NEGATIVE Final    Comment: (NOTE) SARS-CoV-2 target nucleic acids are NOT DETECTED. The SARS-CoV-2 RNA is generally detectable in upper respiratoy specimens during the acute phase of infection. The lowest concentration of SARS-CoV-2 viral copies this assay can detect is 131 copies/mL. A negative result does not preclude SARS-Cov-2 infection and should not be used as the sole basis for treatment or other patient management decisions. A negative result may occur with  improper specimen collection/handling, submission of specimen other than nasopharyngeal swab, presence of viral mutation(s) within the areas targeted by this assay, and inadequate number of viral copies (<131 copies/mL). A  negative result must be combined with clinical observations, patient history, and epidemiological information. The expected result is Negative. Fact Sheet for Patients:  PinkCheek.be Fact Sheet for Healthcare Providers:  GravelBags.it This test is not yet ap proved or cleared by the [place] FDA and  has been authorized for detection and/or diagnosis of SARS-CoV-2 by FDA under an Emergency Use Authorization (EUA). This EUA will remain  in effect (meaning this test can be used) for the duration of the COVID-19 declaration under Section 564(b)(1) of the Act, 21 U.S.C. section 360bbb-3(b)(1), unless the authorization is terminated or revoked sooner.    Influenza A by PCR NEGATIVE NEGATIVE Final   Influenza B by PCR NEGATIVE NEGATIVE Final    Comment: (NOTE) The Xpert Xpress SARS-CoV-2/FLU/RSV assay is intended as an aid in  the diagnosis of influenza from Nasopharyngeal swab specimens  and  should not be used as a sole basis for treatment. Nasal washings and  aspirates are unacceptable for Xpert Xpress SARS-CoV-2/FLU/RSV  testing. Fact Sheet for Patients: PinkCheek.be Fact Sheet for Healthcare Providers: GravelBags.it This test is not yet approved or cleared by the [place] FDA and  has been authorized for detection and/or diagnosis of SARS-CoV-2 by  FDA under an Emergency Use Authorization (EUA). This EUA will remain  in effect (meaning this test can be used) for the duration of the  Covid-19 declaration under Section 564(b)(1) of the Act, 21  U.S.C. section 360bbb-3(b)(1), unless the authorization is  terminated or revoked. Performed at [facility], [place] [address], [place], [place] [address]       Studies: No results found.  Scheduled Meds: . Adalimumab  40 mg Subcutaneous Q7 days  . enoxaparin (LOVENOX) injection  40 mg Subcutaneous Q24H  . insulin aspart  0-6 Units Subcutaneous TID WC  . methylPREDNISolone (SOLU-MEDROL) injection  60 mg Intravenous Q8H    Continuous Infusions: . lactated ringers 75 mL/hr at 03/29/19 1149     LOS: 5 days     Kayleen Memos, MD [facility] Pager [phone]  If 7PM-7AM, please contact night-coverage [email or web address] Password [facility] 03/29/2019, 3:09 PM

## 2019-03-30 LAB — COMPREHENSIVE METABOLIC PANEL
ALT: 16 U/L (ref 0–44)
AST: 23 U/L (ref 15–41)
Albumin: 3.4 g/dL — ABNORMAL LOW (ref 3.5–5.0)
Alkaline Phosphatase: 47 U/L (ref 38–126)
Anion gap: 8 (ref 5–15)
BUN: 12 mg/dL (ref 8–23)
CO2: 24 mmol/L (ref 22–32)
Calcium: 9.1 mg/dL (ref 8.9–10.3)
Chloride: 103 mmol/L (ref 98–111)
Creatinine, Ser: 0.92 mg/dL (ref 0.61–1.24)
GFR calc Af Amer: >60 mL/min (ref >60)
GFR calc non Af Amer: >60 mL/min (ref >60)
Glucose, Bld: 292 mg/dL — ABNORMAL HIGH (ref 70–99)
Potassium: 4 mmol/L (ref 3.5–5.1)
Sodium: 135 mmol/L (ref 135–145)
Total Bilirubin: 0.8 mg/dL (ref 0.3–1.2)
Total Protein: 6.2 g/dL — ABNORMAL LOW (ref 6.5–8.1)

## 2019-03-30 LAB — CBC WITH DIFFERENTIAL/PLATELET
Abs Immature Granulocytes: 0.08 10*3/uL — ABNORMAL HIGH (ref 0.00–0.07)
Basophils Absolute: 0 10*3/uL (ref 0.0–0.1)
Basophils Relative: 0 %
Eosinophils Absolute: 0 10*3/uL (ref 0.0–0.5)
Eosinophils Relative: 0 %
HCT: 33.8 % — ABNORMAL LOW (ref 39.0–52.0)
Hemoglobin: 11.3 g/dL — ABNORMAL LOW (ref 13.0–17.0)
Immature Granulocytes: 1 %
Lymphocytes Relative: 6 %
Lymphs Abs: 0.7 10*3/uL (ref 0.7–4.0)
MCH: 30.2 pg (ref 26.0–34.0)
MCHC: 33.4 g/dL (ref 30.0–36.0)
MCV: 90.4 fL (ref 80.0–100.0)
Monocytes Absolute: 0.4 10*3/uL (ref 0.1–1.0)
Monocytes Relative: 4 %
Neutro Abs: 9.6 10*3/uL — ABNORMAL HIGH (ref 1.7–7.7)
Neutrophils Relative %: 89 %
Platelets: 278 10*3/uL (ref 150–400)
RBC: 3.74 MIL/uL — ABNORMAL LOW (ref 4.22–5.81)
RDW: 13.9 % (ref 11.5–15.5)
WBC: 10.7 10*3/uL — ABNORMAL HIGH (ref 4.0–10.5)
nRBC: 0 % (ref 0.0–0.2)

## 2019-03-30 LAB — GLUCOSE, CAPILLARY
Glucose-Capillary: 260 mg/dL — ABNORMAL HIGH (ref 70–99)
Glucose-Capillary: 226 mg/dL — ABNORMAL HIGH (ref 70–99)
Glucose-Capillary: 328 mg/dL — ABNORMAL HIGH (ref 70–99)
Glucose-Capillary: 214 mg/dL — ABNORMAL HIGH (ref 70–99)

## 2019-03-30 LAB — MAGNESIUM: Magnesium: 2.1 mg/dL (ref 1.7–2.4)

## 2019-03-30 MED ORDER — BOOST / RESOURCE BREEZE PO LIQD CUSTOM
1.0000 | Freq: Three times a day (TID) | ORAL | Status: DC
  Administered 2019-03-30 (×2): 1 via ORAL

## 2019-03-30 MED ORDER — PREDNISONE 20 MG PO TABS
40.0000 mg | ORAL_TABLET | Freq: Every day | ORAL | Status: DC
  Administered 2019-03-30 – 2019-03-31 (×2): 40 mg via ORAL
  Filled 2019-03-30 (×2): qty 2

## 2019-03-30 MED ORDER — ADULT MULTIVITAMIN W/MINERALS CH
1.0000 | ORAL_TABLET | Freq: Every day | ORAL | Status: DC
  Administered 2019-03-30: 1 via ORAL

## 2019-03-30 NOTE — Progress Notes (Signed)
PROGRESS NOTE  Eric Moody QQI:[id number] DOB: [birth year] DOA: 03/23/2019 PCP: Lavone Orn, MD  HPI/Recap of past 24 hours: 69 y.o.malewith medical history significant ofCrohn's disease with terminal ileal stricture and ulceration admitted with worsening abdominal pain, nausea and vomiting.  Found to have partial small bowel obstruction due to Crohn's disease with stricture.  General surgery and GI are following.  NG tube placed. Clamped on 3/4 and started on clear liquid diet.   No acute events overnight.  Mobilizing without difficulty.  Positive flatus, + BM.  NG tube removed on 03/30/2019.  03/30/19: Seen and examined.  Reports positive flatus and bowel movement this morning.  Diet being advanced at [facility] recommendation.   Assessment/Plan: Active Problems:   SBO (small bowel obstruction) (HCC)  Resolved partial small bowel obstructionin the setting of Crohn's diseasewith stricture-. Positive flatus, positive BM. NG tube clamped on 3/4, started on clear liquid diet.  Diet advanced to full liquid.  Tolerated well.  NG tube DC'd on 03/30/2019.  Will advance to soft for dinner if tolerates full liquid diet at [facility] recommendation.  Continue to maintain potassium greater than 4.0 and magnesium greater than 2.0- Humira and azathioprine resumed on 3/4.   GI and general surgery directing care.   Continue IV solumedrol 60 mg TID, started on 3/3>> 3/5.  Start p.o. Protonix 40 mg daily for GI prophylaxis on steroids Prednisone 40 mg daily started on 03/30/2019. Continue to mobilize as tolerated  Type 2 diabetes with hyperglycemia exacerbated by IV steroids CBG 62 3/3>>245 on 3/4. Hemoglobin A1c 6.2 on 03/23/2019. Continue insulin sliding scale.  Resolved post repletion mild hypokalemia K of 3.4 >> 3.8>> 4.0 Goal K+ >4.0 and mag >2.0 Potassium and magnesium at goal.  Resolved post repletion: Anion gap metabolic acidosis Chemistry bicarb 19>> 16>>24 AG 13>> 8 Received 2 Amps of  bicarb    Estimated body mass index is 21.71 kg/m as calculated from the following:   Height as of this encounter: 5' 9"  (1.753 m).   Weight as of this encounter: 66.7 kg.  DVT prophylaxis:lovenox sq daily Code Status:full Family Communication: Has been updating his daughter via phone.  Disposition Plan:Patient is from home.  Anticipate discharge to home likely tomorrow 03/31/2019 if able to tolerate a soft diet without recurrence of small bowel obstruction.  Consultants:GI, surgery  Procedures:ngt 2/26>> 3/5  Antimicrobials:none      Objective: Vitals:   03/29/19 1350 03/29/19 2042 03/30/19 0548 03/30/19 1404  BP: (!) 156/87 126/84 120/68 132/71  Pulse: 81 (!) 54 (!) 51 76  Resp: 15 16 16 18   Temp: 97.9 F (36.6 C) 97.8 F (36.6 C) 97.7 F (36.5 C) (!) 97.5 F (36.4 C)  TempSrc: Oral   Oral  SpO2: 99% 97% 97% 94%  Weight:      Height:        Intake/Output Summary (Last 24 hours) at 03/30/2019 1506 Last data filed at 03/30/2019 9417 Gross per 24 hour  Intake 1846.44 ml  Output --  Net 1846.44 ml   Filed Weights   03/23/19 1156 03/29/19 0837  Weight: 66.7 kg 65 kg    Exam:  . General: 69 y.o. year-old male well-developed well-nourished no acute distress.  Alert and oriented x3.   . Cardiovascular: Regular rate and rhythm no rubs or gallops.   Marland Kitchen Respiratory: Clear to auscultation no wheezes or rales.  Good respiratory effort. . Abdomen: Soft nontender normal bowel sounds present. . Musculoskeletal: No lower extremity edema bilaterally.   Marland Kitchen  Psychiatry: Mood is appropriate for condition and setting.  Data Reviewed: CBC: Recent Labs  Lab 03/24/19 0600 03/26/19 1321 03/27/19 0515 03/30/19 0622  WBC 6.0 6.7 7.5 10.7*  NEUTROABS  --   --   --  9.6*  HGB 11.6* 12.3* 11.9* 11.3*  HCT 35.4* 36.2* 35.3* 33.8*  MCV 91.9 89.4 90.5 90.4  PLT 268 266 260 353   Basic Metabolic Panel: Recent Labs  Lab 03/26/19 1321 03/27/19 0515 03/28/19 0537  03/29/19 0629 03/30/19 0622  NA 136 136 136 134* 135  K 3.4* 3.3* 3.8 4.2 4.0  CL 104 101 106 105 103  CO2 22 22 19* 16* 24  GLUCOSE 121* 81 85 245* 292*  BUN 10 11 11 12 12   CREATININE 0.86 0.87 0.80 0.86 0.92  CALCIUM 8.3* 8.4* 8.3* 8.5* 9.1  MG  --  2.1 2.2 2.2 2.1  PHOS  --   --   --  3.2  --    GFR: Estimated Creatinine Clearance: 70.7 mL/min (by C-G formula based on SCr of 0.92 mg/dL). Liver Function Tests: Recent Labs  Lab 03/24/19 0600 03/26/19 1321 03/30/19 0622  AST 15 15 23   ALT 14 11 16   ALKPHOS 45 51 47  BILITOT 1.3* 1.3* 0.8  PROT 6.2* 6.7 6.2*  ALBUMIN 3.2* 3.4* 3.4*   No results for input(s): LIPASE, AMYLASE in the last 168 hours. No results for input(s): AMMONIA in the last 168 hours. Coagulation Profile: Recent Labs  Lab 03/27/19 0515  INR 1.1   Cardiac Enzymes: No results for input(s): CKTOTAL, CKMB, CKMBINDEX, TROPONINI in the last 168 hours. BNP (last 3 results) No results for input(s): PROBNP in the last 8760 hours. HbA1C: No results for input(s): HGBA1C in the last 72 hours. CBG: Recent Labs  Lab 03/29/19 1140 03/29/19 1705 03/29/19 2043 03/30/19 0750 03/30/19 1138  GLUCAP 318* 189* 266* 260* 226*   Lipid Profile: No results for input(s): CHOL, HDL, LDLCALC, TRIG, CHOLHDL, LDLDIRECT in the last 72 hours. Thyroid Function Tests: No results for input(s): TSH, T4TOTAL, FREET4, T3FREE, THYROIDAB in the last 72 hours. Anemia Panel: No results for input(s): VITAMINB12, FOLATE, FERRITIN, TIBC, IRON, RETICCTPCT in the last 72 hours. Urine analysis:    Component Value Date/Time   COLORURINE YELLOW 03/23/2019 1513   APPEARANCEUR CLEAR 03/23/2019 1513   LABSPEC 1.026 03/23/2019 1513   PHURINE 5.0 03/23/2019 1513   GLUCOSEU 150 (A) 03/23/2019 1513   HGBUR NEGATIVE 03/23/2019 1513   BILIRUBINUR NEGATIVE 03/23/2019 1513   KETONESUR 5 (A) 03/23/2019 1513   PROTEINUR NEGATIVE 03/23/2019 1513   NITRITE NEGATIVE 03/23/2019 1513    LEUKOCYTESUR NEGATIVE 03/23/2019 1513   Sepsis Labs: @LABRCNTIP (procalcitonin:4,lacticidven:4)  ) Recent Results (from the past 240 hour(s))  Respiratory Panel by RT PCR (Flu A&B, Covid) - Nasopharyngeal Swab     Status: None   Collection Time: 03/23/19  4:55 PM   Specimen: Nasopharyngeal Swab  Result Value Ref Range Status   SARS Coronavirus 2 by RT PCR NEGATIVE NEGATIVE Final    Comment: (NOTE) SARS-CoV-2 target nucleic acids are NOT DETECTED. The SARS-CoV-2 RNA is generally detectable in upper respiratoy specimens during the acute phase of infection. The lowest concentration of SARS-CoV-2 viral copies this assay can detect is 131 copies/mL. A negative result does not preclude SARS-Cov-2 infection and should not be used as the sole basis for treatment or other patient management decisions. A negative result may occur with  improper specimen collection/handling, submission of specimen other than nasopharyngeal swab, presence  of viral mutation(s) within the areas targeted by this assay, and inadequate number of viral copies (<131 copies/mL). A negative result must be combined with clinical observations, patient history, and epidemiological information. The expected result is Negative. Fact Sheet for Patients:  PinkCheek.be Fact Sheet for Healthcare Providers:  GravelBags.it This test is not yet ap proved or cleared by the [place] FDA and  has been authorized for detection and/or diagnosis of SARS-CoV-2 by FDA under an Emergency Use Authorization (EUA). This EUA will remain  in effect (meaning this test can be used) for the duration of the COVID-19 declaration under Section 564(b)(1) of the Act, 21 U.S.C. section 360bbb-3(b)(1), unless the authorization is terminated or revoked sooner.    Influenza A by PCR NEGATIVE NEGATIVE Final   Influenza B by PCR NEGATIVE NEGATIVE Final    Comment: (NOTE) The Xpert Xpress  SARS-CoV-2/FLU/RSV assay is intended as an aid in  the diagnosis of influenza from Nasopharyngeal swab specimens and  should not be used as a sole basis for treatment. Nasal washings and  aspirates are unacceptable for Xpert Xpress SARS-CoV-2/FLU/RSV  testing. Fact Sheet for Patients: PinkCheek.be Fact Sheet for Healthcare Providers: GravelBags.it This test is not yet approved or cleared by the [place] FDA and  has been authorized for detection and/or diagnosis of SARS-CoV-2 by  FDA under an Emergency Use Authorization (EUA). This EUA will remain  in effect (meaning this test can be used) for the duration of the  Covid-19 declaration under Section 564(b)(1) of the Act, 21  U.S.C. section 360bbb-3(b)(1), unless the authorization is  terminated or revoked. Performed at [facility], [place] [address], [place], [place] [address]       Studies: No results found.  Scheduled Meds: . Adalimumab  40 mg Subcutaneous Q7 days  . azaTHIOprine  150 mg Oral Daily  . enoxaparin (LOVENOX) injection  40 mg Subcutaneous Q24H  . insulin aspart  0-5 Units Subcutaneous QHS  . insulin aspart  0-9 Units Subcutaneous TID WC  . pantoprazole  40 mg Oral Daily  . predniSONE  40 mg Oral Q breakfast    Continuous Infusions:    LOS: 6 days     Kayleen Memos, MD [facility] Pager [phone]  If 7PM-7AM, please contact night-coverage [email or web address] Password [facility] 03/30/2019, 3:06 PM

## 2019-03-30 NOTE — Progress Notes (Signed)
NG tube d/c'd with no distress per MD order.

## 2019-03-30 NOTE — Progress Notes (Signed)
Eric Moody 8:26 AM  Subjective: Patient did well yesterday with clear liquid diet and is feeling better and wants to go home and we discussed advancing diet to fast and again discussed surgical options etc. and answered all of his questions he did get his Humira injection yesterday and even started himself on his p.o. Imuran  Objective: Vital signs stable afebrile no acute distress abdomen is soft nontender in good spirits labs stable  Assessment: Crohn's disease  Plan: If he does well with clear liquid diet for breakfast may have full liquids for lunch and then if doing well may have soft solids tonight and hopefully go home tomorrow and I would discharge him on 40 mg of prednisone and have him follow-up with Dr. Alessandra Bevels in a week or 2 to discuss weaning protocols and discuss his referral to [facility] and please asked dietary to discuss low residue diet with the patient prior to discharge and call my partner Dr. Cristina Gong if any question or problem this weekend  [facility]  office [phone] After 5PM or if no answer call [phone]

## 2019-03-30 NOTE — Plan of Care (Signed)
Nutrition Education Note  RD consulted for nutrition education regarding Low Residue Diet   RD provided "Fiber Restricted Nutrition Therapy" handout handout from the Academy of Nutrition and Dietetics. Explained reasons for pt to follow a low fiber diet and provided sample menu. Reviewed patient's dietary recall and discussed ways for pt to meet nutrition goals over the next several weeks.  Teach back method used. Pt verbalizes understanding of information provided.   Expect excellent compliance.  Body mass index is Body mass index is 21.15 kg/m.Marland Kitchen Pt meets criteria for normal based on current BMI.  Current diet order is [place] , no documented meals at this time. RD will provide Boost Breeze supplement to aid with estimated needs and recommend daily MVI. Labs and medications reviewed. No further nutrition interventions warranted at this time. RD contact information provided. If additional nutrition issues arise, please re-consult RD.  Lajuan Lines, RD, LDN Clinical Nutrition After Hours/Weekend Pager # in [place]

## 2019-03-31 LAB — GLUCOSE, CAPILLARY: Glucose-Capillary: 144 mg/dL — ABNORMAL HIGH (ref 70–99)

## 2019-03-31 MED ORDER — ADULT MULTIVITAMIN W/MINERALS CH: 1.0000 | ORAL_TABLET | Freq: Every day | ORAL | 0 refills | Status: AC

## 2019-03-31 MED ORDER — PREDNISONE 20 MG PO TABS: 40.0000 mg | ORAL_TABLET | Freq: Every day | ORAL | 0 refills | Status: AC

## 2019-03-31 NOTE — Discharge Instructions (Signed)
Bowel Obstruction A bowel obstruction means that something is blocking the small or large bowel. The bowel is also called the intestine. It is the long tube that connects the stomach to the opening of the butt (anus). When something blocks the bowel, food and fluids cannot pass through like normal. This condition needs to be treated. Treatment depends on the cause of the problem and how bad the problem is. What are the causes? Common causes of this condition include:  Scar tissue (adhesions) from past surgery or from high-energy X-rays (radiation).  Recent surgery in the belly. This affects how food moves in the bowel.  Some diseases, such as: ? Irritation of the lining of the digestive tract (Crohn's disease). ? Irritation of small pouches in the bowel (diverticulitis).  Growths or tumors.  A bulging organ (hernia).  Twisting of the bowel (volvulus).  A foreign body.  Slipping of a part of the bowel into another part (intussusception). What are the signs or symptoms? Symptoms of this condition include:  Pain in the belly.  Feeling sick to your stomach (nauseous).  Throwing up (vomiting).  Bloating in the belly.  Being unable to pass gas.  Trouble pooping (constipation).  Watery poop (diarrhea).  A lot of belching. How is this diagnosed? This condition may be diagnosed based on:  A physical exam.  Medical history.  Imaging tests, such as X-ray or CT scan.  Blood tests.  Urine tests. How is this treated? Treatment for this condition may include:  Fluids and pain medicines that are given through an IV tube. Your doctor may tell you not to eat or drink if you feel sick to your stomach and are throwing up.  Eating a clear liquid diet for a few days.  Putting a small tube (nasogastric tube) into the stomach. This will help with pain, discomfort, and nausea by removing blocked air and fluids from the stomach.  Surgery. This may be needed if other treatments do  not work. Follow these instructions at home: Medicines  Take over-the-counter and prescription medicines only as told by your doctor.  If you were prescribed an antibiotic medicine, take it as told by your doctor. Do not stop taking the antibiotic even if you start to feel better. General instructions  Follow your diet as told by your doctor. You may need to: ? Only drink clear liquids until you start to get better. ? Avoid solid foods.  Return to your normal activities as told by your doctor. Ask your doctor what activities are safe for you.  Do not sit for a long time without moving. Get up to take short walks every 1-2 hours. This is important. Ask for help if you feel weak or unsteady.  Keep all follow-up visits as told by your doctor. This is important. How is this prevented? After having a bowel obstruction, you may be more likely to have another. You can do some things to stop it from happening again.  If you have a long-term (chronic) disease, contact your doctor if you see changes or problems.  Take steps to prevent or treat trouble pooping. Your doctor may ask that you: ? Drink enough fluid to keep your pee (urine) pale yellow. ? Take over-the-counter or prescription medicines. ? Eat foods that are high in fiber. These include beans, whole grains, and fresh fruits and vegetables. ? Limit foods that are high in fat and sugar. These include fried or sweet foods.  Stay active. Ask your doctor which exercises are  safe for you.  Avoid stress.  Eat three small meals and three small snacks each day.  Work with a Publishing rights manager (dietitian) to make a meal plan that works for you.  Do not use any products that contain nicotine or tobacco, such as cigarettes and e-cigarettes. If you need help quitting, ask your doctor. Contact a doctor if:  You have a fever.  You have chills. Get help right away if:  You have pain or cramps that get worse.  You throw up blood.  You are  sick to your stomach.  You cannot stop throwing up.  You cannot drink fluids.  You feel mixed up (confused).  You feel very thirsty (dehydrated).  Your belly gets more bloated.  You feel weak or you pass out (faint). Summary  A bowel obstruction means that something is blocking the small or large bowel.  Treatment may include IV fluids and pain medicine. You may also have a clear liquid diet, a small tube in your stomach, or surgery.  Drink clear liquids and avoid solid foods until you get better. This information is not intended to replace advice given to you by your health care provider. Make sure you discuss any questions you have with your health care provider. Document Revised: 05/25/2017 Document Reviewed: 05/25/2017 Elsevier Patient Education  [place].

## 2019-03-31 NOTE — Discharge Summary (Signed)
Discharge Summary  Eric Moody CBJ:[id number] DOB: 12-30-1950  PCP: Lavone Orn, MD  Admit date: 03/23/2019 Discharge date: 03/31/2019  Time spent: 35 minutes  Recommendations for Outpatient Follow-up:  1. Follow up with GI Dr. Alessandra Bevels in 1 week. 2. Follow up with your PCP in 1-2 weeks. 3. Take your medications as prescribed.  Discharge Diagnoses:  Active Hospital Problems   Diagnosis Date Noted  . SBO (small bowel obstruction) ([place]) 03/23/2019    Resolved Hospital Problems  No resolved problems to display.    Discharge Condition: Stable.   Diet recommendation: Continue low residue diet.   Vitals:   03/30/19 2147 03/31/19 0437  BP: 118/68 102/76  Pulse: (!) 56 (!) 55  Resp: 16 16  Temp: 97.7 F (36.5 C) 97.8 F (36.6 C)  SpO2: 97% 97%    History of present illness:  69 y.o.malewith medical history significant ofCrohn's disease with terminal ileal stricture and ulceration admitted withworseningabdominal pain, nausea and vomiting. Found to have partial small bowel obstruction due to Crohn's disease with stricture.  General surgery and GI directed care.  NG tube placed 03/23/19. Clamped on 03/29/19 and started on clear liquid diet.  Tolerated well.  NG tube removed on 03/30/19.  Diet advanced to full liquid diet then soft diet the evening of 03/30/19.  Tolerated his diet well.  Received education from dietitian on low residue diet with good understanding and teaching back.  03/31/19:  No acute events overnight.  He has no new complaints.  Eager to go home.  Hospital Course:  Active Problems:   SBO (small bowel obstruction) ([facility])  Resolved partial small bowel obstructionin the setting of Crohn's diseasewith stricture-. Positive flatus, positive BM.  No diarrhea.  No nausea or vomiting. NG tube clamped on 3/4, started on clear liquid diet.  Diet advanced to full liquid.  Tolerated well.  NG tube DC'd on 03/30/2019.  Tolerated soft diet/low residue diet. Electrolytes are  normal.  Labs unremarkable except for steroid induced hyperglycemia. Vital signs stable. Humira and azathioprine restarted on 3/4.   Coimpleted IV solumedrol 60 mg TID, started on 3/3>> 3/5.   Started Prednisone 40 mg daily on 03/30/2019, continue until you see your GI specialist.  Type 2 diabetes with steroids induced hyperglycemia Hemoglobin A1c 6.2 on 03/23/2019. Resume home regimen Follow up with your PCP in 1-2 weeks  Resolved post repletion mild hypokalemia K of 3.4 >> 3.8>> 4.0 Potassium and magnesium at goal.  Resolved post repletion: Anion gap metabolic acidosis Chemistry bicarb 19>> 16>>24 AG 13>> 8 Received 2 Amps of bicarb    Code Status:full   Consultants:GI, surgery  Procedures:NG tube placement 2/26 >> 03/30/19.    Discharge Exam: BP 102/76 (BP Location: Right Arm)   Pulse (!) 55   Temp 97.8 F (36.6 C) (Oral)   Resp 16   Ht 5' 9"  (1.753 m)   Wt 65 kg   SpO2 97%   BMI 21.15 kg/m  . General: 68 y.o. year-old male well developed well nourished in no acute distress.  Alert and oriented x3. . Cardiovascular: Regular rate and rhythm with no rubs or gallops.  No thyromegaly or JVD noted.   Marland Kitchen Respiratory: Clear to auscultation with no wheezes or rales. Good inspiratory effort. . Abdomen: Soft nontender nondistended with normal bowel sounds x4 quadrants. . Musculoskeletal: No lower extremity edema. 2/4 pulses in all 4 extremities. . Skin: No ulcerative lesions noted or rashes, . Psychiatry: Mood is appropriate for condition and setting  Discharge Instructions  You were cared for by a hospitalist during your hospital stay. If you have any questions about your discharge medications or the care you received while you were in the hospital after you are discharged, you can call the unit and asked to speak with the hospitalist on call if the hospitalist that took care of you is not available. Once you are discharged, your primary care physician will handle any  further medical issues. Please note that NO REFILLS for any discharge medications will be authorized once you are discharged, as it is imperative that you return to your primary care physician (or establish a relationship with a primary care physician if you do not have one) for your aftercare needs so that they can reassess your need for medications and monitor your lab values.   Allergies as of 03/31/2019      Reactions   Penicillins Other (See Comments)   Did it involve swelling of the face/tongue/throat, SOB, or low BP? Unknown Did it involve sudden or severe rash/hives, skin peeling, or any reaction on the inside of your mouth or nose? Unknown Did you need to seek medical attention at a hospital or doctor's office? Unknown When did it last happen?Childhood If all above answers are "NO", may proceed with cephalosporin use.   Sulfa Antibiotics    Childhood      Medication List    TAKE these medications   azaTHIOprine 50 MG tablet Commonly known as: IMURAN Take 150 mg by mouth daily.   fexofenadine 180 MG tablet Commonly known as: ALLEGRA Take 180 mg by mouth daily.   fluticasone 50 MCG/ACT nasal spray Commonly known as: FLONASE Place 1 spray into both nostrils daily as needed for allergies or rhinitis.   glimepiride 2 MG tablet Commonly known as: AMARYL Take 2 mg by mouth daily with breakfast.   guaiFENesin 600 MG 12 hr tablet Commonly known as: MUCINEX Take 600 mg by mouth 2 (two) times daily as needed for cough.   Humira 40 MG/0.4ML Pskt Generic drug: Adalimumab Inject 40 mg into the skin once a week. Injects on Thursdays   lisinopril 10 MG tablet Commonly known as: ZESTRIL Take 10 mg by mouth daily.   multivitamin with minerals Tabs tablet Take 1 tablet by mouth daily.   pantoprazole 40 MG tablet Commonly known as: PROTONIX Take 40 mg by mouth daily.   predniSONE 20 MG tablet Commonly known as: DELTASONE Take 2 tablets (40 mg total) by mouth daily with  breakfast for 10 days. Start taking on: April 01, 2019      Allergies  Allergen Reactions  . Penicillins Other (See Comments)    Did it involve swelling of the face/tongue/throat, SOB, or low BP? Unknown Did it involve sudden or severe rash/hives, skin peeling, or any reaction on the inside of your mouth or nose? Unknown Did you need to seek medical attention at a hospital or doctor's office? Unknown When did it last happen?Childhood If all above answers are "NO", may proceed with cephalosporin use.  Ignacia Bayley Antibiotics     Childhood   Follow-up Information    Lavone Orn, MD. Call in 1 day(s).   Specialty: Internal Medicine Contact information: [address] [place] [address] [phone]        Otis Brace, MD. Call in 1 day(s).   Specialty: Gastroenterology Why: Please call for a post hospital follow-up appointment. Contact information: [address] [place] [address] [phone]  The results of significant diagnostics from this hospitalization (including imaging, microbiology, ancillary and laboratory) are listed below for reference.    Significant Diagnostic Studies: MR PELVIS W WO CONTRAST  Result Date: 03/27/2019 : Please see accession #[id number] for MRI abdomen and pelvis complete report. Electronically Signed   By: Van Clines M.D.   On: 03/27/2019 08:47   MR ENTERO ABDOMEN W WO CONTRAST  Result Date: 03/27/2019 CLINICAL DATA:  Inflammatory bowel disease with new small bowel dilatation favoring distal small bowel obstruction. EXAM: MR ABDOMEN AND PELVIS WITHOUT AND WITH CONTRAST (MR ENTEROGRAPHY) TECHNIQUE: Multiplanar, multisequence MRI of the abdomen and pelvis was performed both before and during bolus administration of intravenous contrast. Negative oral contrast VoLumen was given. CONTRAST:  75m GADAVIST GADOBUTROL 1 MMOL/ML IV SOLN COMPARISON:  CT angiogram dated 03/23/2019 FINDINGS:  COMBINED FINDINGS FOR BOTH MR ABDOMEN AND PELVIS Lower chest: The small basilar pulmonary nodules are not conspicuous on today's MRI. Hepatobiliary: Unremarkable Pancreas:  Unremarkable Spleen:  Unremarkable Adrenals/Urinary Tract: The patient has a known small nonobstructive right mid kidney calculus shown on 03/23/2019 which is less well seen by MRI. 1.2 cm Bosniak category 1 cyst of the left kidney lower pole. No hydronephrosis. Adrenal glands normal. Urinary bladder unremarkable. Stomach/Bowel: There is abnormal wall thickening and luminal narrowing with associated abnormal accentuated mucosal enhancement in the arterial phase persisting into the 90 second images involving the terminal 30 cm of the small bowel extending to the cecum, favoring transmural inflammation. Small focus of fat necrosis or localized inflammation of a fatty lobule along the anterior margin of the terminal ileum shown on images 14-15 of series 4, versus less likely a small diverticulum. On image 30/24, there is a focal 1.6 cm fluid collection with less in the way of surrounding mucosal inflammation, raising the possibility of an intramural fluid collection rather than a luminal fluid collection, such that a small intramural abscess is not excluded. However, the appearance was less compelling for abscess and more suggestive of intraluminal fluid on 03/23/2019. Proximal to the inflamed loops of distal small bowel, there is dilatation of the small bowel up to about 5.3 cm in diameter. At the interface between the dilated and nondilated small bowel, there is a 3.2 cm segment of luminal narrowing and luminal inflammation with adjacent mesenteric stranding/inflammation, suspicious for potential inflammatory stricture, as shown on image 37/24. No additional areas of specific bowel involvement are identified. No compelling evidence of colitis. There are some scattered small diverticula of the sigmoid colon. Vascular/Lymphatic: Mild abdominal  aortic atherosclerotic vascular disease. No adenopathy identified. Reproductive: Not well characterized today due to boundary field defects. Other:  Small amount of pelvic ascites. Musculoskeletal: Partially segmental S1 vertebra. There is a suggestion of lower lumbar spondylosis and degenerative disc disease. No compelling inflammatory findings along the SI joints. IMPRESSION: 1. Abnormal wall thickening and enhancement involving the terminal 30 cm of the small bowel extending to the cecum, favoring active Crohn's disease. Along the proximal margin of this involvement, there is a 3.2 cm segment of luminal narrowing and adjacent mesenteric stranding/enhancement, suspicious for inflammatory stricture, demarcating atria transition between dilated loops of small bowel and the distal small bowel segment inflamed. 2. 1.6 cm fluid signal intensity along the wall of the terminal ileum, equivocal for intramural abscess versus intraluminal fluid, although taking into account the prior CT appearance I favor the latter. 3. Small amount of pelvic ascites. 4. Previously seen small basilar pulmonary nodules are not conspicuous on today's exam. 5. Small  nonobstructive right mid kidney calculus. 6. Lumbar spondylosis and degenerative disc disease. Electronically Signed   By: Van Clines M.D.   On: 03/27/2019 08:43   CT ENTERO ABD/PELVIS W CONTAST  Result Date: 03/23/2019 CLINICAL DATA:  Left lower abdominal pain for 12 days. History of Crohn's ileitis/inflammatory bowel disease. Small-bowel obstruction. EXAM: CT ABDOMEN AND PELVIS WITH CONTRAST (ENTEROGRAPHY) TECHNIQUE: Multidetector CT of the abdomen and pelvis during bolus administration of intravenous contrast. Negative oral contrast was given. CONTRAST:  165m OMNIPAQUE IOHEXOL 300 MG/ML  SOLN COMPARISON:  Abdominopelvic CT 02/09/2018 and 04/17/2014. Upper GI series 02/16/2019. FINDINGS: Lower chest: There are small subpleural nodules at both lung bases including a 5  mm nodule in the right lower lobe on image 21/6 which is unchanged. There are other nodules which were not previously imaged, including a 4 mm right lower lobe nodule on image 3/6, and a 6 mm left lower lobe nodule on image 6/6. No significant pleural or pericardial effusion. Hepatobiliary: The liver is normal in density without suspicious focal abnormality. No evidence of gallstones, gallbladder wall thickening or biliary dilatation. Pancreas: Unremarkable. No pancreatic ductal dilatation or surrounding inflammatory changes. Spleen: Normal in size without focal abnormality. Adrenals/Urinary Tract: Both adrenal glands appear normal. Single nonobstructing calculus in the interpolar region of the right kidney, best seen on coronal image 115/7. No evidence of ureteral calculus or hydronephrosis. There are small left renal cysts, largest in the lower pole. The bladder appears normal. Stomach/Bowel: The stomach is fluid-filled and mildly distended. Several duodenal diverticula are again noted. There is moderate diffuse small bowel distension which is new compared with the prior CT. A long segment of wall thickening involving the terminal ileum is again noted, similar in distribution to the previous studies. The degree of wall thickening in these areas has improved compared with the most recent CT of 13 months ago. The colon appears decompressed without wall thickening. The appendix appears normal. No evidence of bowel perforation. Vascular/Lymphatic: There are no enlarged abdominal or pelvic lymph nodes. Minimal aortic and branch vessel atherosclerosis. The portal, superior mesenteric and splenic veins are patent. Reproductive: The prostate gland and seminal vesicles appear normal. Other: No evidence of abdominal wall mass or hernia. No ascites. Musculoskeletal: No acute or significant osseous findings. IMPRESSION: 1. New moderate diffuse small bowel distension consistent with distal partial small bowel obstruction.  Previously demonstrated long segment wall thickening of the terminal ileum has improved compared with the most recent CT of 13 months ago, and the obstruction may be related to a developing stricture. 2. No evidence of bowel perforation, abscess or fistula. 3. Nonobstructing right renal calculus. 4. Small subpleural nodules at both lung bases, some of which were not previously imaged. These are of doubtful significance, and no dedicated follow-up is required if this patient is low risk for bronchogenic carcinoma (and has no known or suspected primary neoplasm). Non-contrast chest CT can be considered in 12 months if patient is high-risk. This recommendation follows the consensus statement: Guidelines for Management of Incidental Pulmonary Nodules Detected on CT Images: From the Fleischner Society 2017; Radiology 2017; 284:228-243. Electronically Signed   By: WRichardean SaleM.D.   On: 03/23/2019 15:56   DG Abd 2 Views  Result Date: 03/23/2019 CLINICAL DATA:  Abdominal pain. Abnormal upper GI and small-bowel follow-through. EXAM: ABDOMEN - 2 VIEW COMPARISON:  Upper GI small-bowel follow-through 02/16/2019. CT abdomen and pelvis 02/09/2018. FINDINGS: Multiple prominently distended loops of small bowel noted. Relative paucity of colonic air. Findings most  consistent with small bowel obstruction. No free air is identified. Residual barium noted over the left lower pelvis. No acute bony abnormality identified. IMPRESSION: Multiple prominently distended loops of small bowel noted most consistent with small bowel obstruction. Small-bowel obstruction may be secondary to previously identified ileal disease. Reference is made to prior upper GI small-bowel report of 02/16/2019 and CT abdomen and pelvis report of 02/09/2018. Electronically Signed   By: Marcello Moores  Register   On: 03/23/2019 10:37   DG Abd Portable 1V  Result Date: 03/24/2019 CLINICAL DATA:  69 year old male status post NG placement. EXAM: PORTABLE ABDOMEN -  1 VIEW COMPARISON:  Abdominal radiograph dated 03/23/2019. FINDINGS: Enteric tube with tip and side-port in the right hemiabdomen. The tip is likely in the distal stomach or second portion of the duodenum. Dilated air-filled loops of small bowel measure up to 4.5 cm. The osseous structures and soft tissues are grossly unremarkable. IMPRESSION: 1. Enteric tube with tip in the distal stomach or second portion of the duodenum. 2. Persistent dilatation of small-bowel loops. Electronically Signed   By: Anner Crete M.D.   On: 03/24/2019 18:56   DG Abd Portable 1V  Result Date: 03/23/2019 CLINICAL DATA:  Enteric catheter placement, Crohn disease EXAM: PORTABLE ABDOMEN - 1 VIEW COMPARISON:  03/23/2019 FINDINGS: Frontal view of the abdomen and pelvis excludes the pubic symphysis, hemidiaphragms, and right flank by collimation. Enteric catheter tip projects over gastric body. There is increasing gaseous distension of the small bowel consistent with obstruction. There is excreted contrast within the urinary bladder. No abdominal masses or abnormal calcifications. Barium filled diverticulum within the pelvis. IMPRESSION: 1. Progressive gaseous distention of the small bowel consistent with high-grade obstruction. 2. Enteric catheter overlying gastric body. Electronically Signed   By: Randa Ngo M.D.   On: 03/23/2019 19:16    Microbiology: Recent Results (from the past 240 hour(s))  Respiratory Panel by RT PCR (Flu A&B, Covid) - Nasopharyngeal Swab     Status: None   Collection Time: 03/23/19  4:55 PM   Specimen: Nasopharyngeal Swab  Result Value Ref Range Status   SARS Coronavirus 2 by RT PCR NEGATIVE NEGATIVE Final    Comment: (NOTE) SARS-CoV-2 target nucleic acids are NOT DETECTED. The SARS-CoV-2 RNA is generally detectable in upper respiratoy specimens during the acute phase of infection. The lowest concentration of SARS-CoV-2 viral copies this assay can detect is 131 copies/mL. A negative result  does not preclude SARS-Cov-2 infection and should not be used as the sole basis for treatment or other patient management decisions. A negative result may occur with  improper specimen collection/handling, submission of specimen other than nasopharyngeal swab, presence of viral mutation(s) within the areas targeted by this assay, and inadequate number of viral copies (<131 copies/mL). A negative result must be combined with clinical observations, patient history, and epidemiological information. The expected result is Negative. Fact Sheet for Patients:  PinkCheek.be Fact Sheet for Healthcare Providers:  GravelBags.it This test is not yet ap proved or cleared by the [place] FDA and  has been authorized for detection and/or diagnosis of SARS-CoV-2 by FDA under an Emergency Use Authorization (EUA). This EUA will remain  in effect (meaning this test can be used) for the duration of the COVID-19 declaration under Section 564(b)(1) of the Act, 21 U.S.C. section 360bbb-3(b)(1), unless the authorization is terminated or revoked sooner.    Influenza A by PCR NEGATIVE NEGATIVE Final   Influenza B by PCR NEGATIVE NEGATIVE Final    Comment: (NOTE) The  Xpert Xpress SARS-CoV-2/FLU/RSV assay is intended as an aid in  the diagnosis of influenza from Nasopharyngeal swab specimens and  should not be used as a sole basis for treatment. Nasal washings and  aspirates are unacceptable for Xpert Xpress SARS-CoV-2/FLU/RSV  testing. Fact Sheet for Patients: PinkCheek.be Fact Sheet for Healthcare Providers: GravelBags.it This test is not yet approved or cleared by the [place] FDA and  has been authorized for detection and/or diagnosis of SARS-CoV-2 by  FDA under an Emergency Use Authorization (EUA). This EUA will remain  in effect (meaning this test can be used) for the duration of  the  Covid-19 declaration under Section 564(b)(1) of the Act, 21  U.S.C. section 360bbb-3(b)(1), unless the authorization is  terminated or revoked. Performed at [facility], [place] [address] [place], [place] [address]      Labs: Basic Metabolic Panel: Recent Labs  Lab 03/26/19 1321 03/27/19 0515 03/28/19 0537 03/29/19 0629 03/30/19 0622  NA 136 136 136 134* 135  K 3.4* 3.3* 3.8 4.2 4.0  CL 104 101 106 105 103  CO2 22 22 19* 16* 24  GLUCOSE 121* 81 85 245* 292*  BUN 10 11 11 12 12   CREATININE 0.86 0.87 0.80 0.86 0.92  CALCIUM 8.3* 8.4* 8.3* 8.5* 9.1  MG  --  2.1 2.2 2.2 2.1  PHOS  --   --   --  3.2  --    Liver Function Tests: Recent Labs  Lab 03/26/19 1321 03/30/19 0622  AST 15 23  ALT 11 16  ALKPHOS 51 47  BILITOT 1.3* 0.8  PROT 6.7 6.2*  ALBUMIN 3.4* 3.4*   No results for input(s): LIPASE, AMYLASE in the last 168 hours. No results for input(s): AMMONIA in the last 168 hours. CBC: Recent Labs  Lab 03/26/19 1321 03/27/19 0515 03/30/19 0622  WBC 6.7 7.5 10.7*  NEUTROABS  --   --  9.6*  HGB 12.3* 11.9* 11.3*  HCT 36.2* 35.3* 33.8*  MCV 89.4 90.5 90.4  PLT 266 260 278   Cardiac Enzymes: No results for input(s): CKTOTAL, CKMB, CKMBINDEX, TROPONINI in the last 168 hours. BNP: BNP (last 3 results) No results for input(s): BNP in the last 8760 hours.  ProBNP (last 3 results) No results for input(s): PROBNP in the last 8760 hours.  CBG: Recent Labs  Lab 03/30/19 0750 03/30/19 1138 03/30/19 1608 03/30/19 2128 03/31/19 0744  GLUCAP 260* 226* 328* 214* 144*       Signed:  Kayleen Memos, MD [facility] 03/31/2019, 10:58 AM

## 2019-04-02 DIAGNOSIS — S41102A Unspecified open wound of left upper arm, initial encounter: Secondary | ICD-10-CM | POA: Diagnosis not present

## 2019-04-02 DIAGNOSIS — S41119A Laceration without foreign body of unspecified upper arm, initial encounter: Secondary | ICD-10-CM | POA: Diagnosis not present

## 2019-04-06 ENCOUNTER — Ambulatory Visit: Payer: Medicare Other | Attending: Internal Medicine

## 2019-04-06 DIAGNOSIS — Z23 Encounter for immunization: Secondary | ICD-10-CM

## 2019-04-06 NOTE — Progress Notes (Signed)
   Covid-19 Vaccination Clinic  Name:  Eric Moody    MRN: [id number] DOB: 12-21-1950  04/06/2019  [name] was observed post Covid-19 immunization for 15 minutes without incident. He was provided with Vaccine Information Sheet and instruction to access the V-Safe system.   [name] was instructed to call 911 with any severe reactions post vaccine: Marland Kitchen Difficulty breathing  . Swelling of face and throat  . A fast heartbeat  . A bad rash all over body  . Dizziness and weakness   Immunizations Administered    Name Date Dose VIS Date Route   Pfizer COVID-19 Vaccine 04/06/2019  8:08 AM 0.3 mL 01/05/2019 Intramuscular   Manufacturer: [place]   Lot: [id number]   [place]: [id number]

## 2019-04-11 DIAGNOSIS — S41102D Unspecified open wound of left upper arm, subsequent encounter: Secondary | ICD-10-CM | POA: Diagnosis not present

## 2019-04-17 DIAGNOSIS — K50919 Crohn's disease, unspecified, with unspecified complications: Secondary | ICD-10-CM | POA: Diagnosis not present

## 2019-04-23 DIAGNOSIS — Z880 Allergy status to penicillin: Secondary | ICD-10-CM | POA: Diagnosis not present

## 2019-04-23 DIAGNOSIS — K56699 Other intestinal obstruction unspecified as to partial versus complete obstruction: Secondary | ICD-10-CM | POA: Diagnosis not present

## 2019-04-23 DIAGNOSIS — Z882 Allergy status to sulfonamides status: Secondary | ICD-10-CM | POA: Diagnosis not present

## 2019-04-23 DIAGNOSIS — Z20822 Contact with and (suspected) exposure to covid-19: Secondary | ICD-10-CM | POA: Diagnosis not present

## 2019-04-23 DIAGNOSIS — K56609 Unspecified intestinal obstruction, unspecified as to partial versus complete obstruction: Secondary | ICD-10-CM | POA: Diagnosis not present

## 2019-04-23 DIAGNOSIS — K50012 Crohn's disease of small intestine with intestinal obstruction: Secondary | ICD-10-CM | POA: Diagnosis not present

## 2019-04-23 DIAGNOSIS — R1031 Right lower quadrant pain: Secondary | ICD-10-CM | POA: Diagnosis not present

## 2019-04-23 DIAGNOSIS — N281 Cyst of kidney, acquired: Secondary | ICD-10-CM | POA: Diagnosis not present

## 2019-04-23 DIAGNOSIS — Z6823 Body mass index (BMI) 23.0-23.9, adult: Secondary | ICD-10-CM | POA: Diagnosis not present

## 2019-04-23 DIAGNOSIS — E43 Unspecified severe protein-calorie malnutrition: Secondary | ICD-10-CM | POA: Diagnosis not present

## 2019-04-24 DIAGNOSIS — Z4682 Encounter for fitting and adjustment of non-vascular catheter: Secondary | ICD-10-CM | POA: Diagnosis not present

## 2019-04-24 DIAGNOSIS — K56609 Unspecified intestinal obstruction, unspecified as to partial versus complete obstruction: Secondary | ICD-10-CM | POA: Diagnosis not present

## 2019-04-24 DIAGNOSIS — Z20822 Contact with and (suspected) exposure to covid-19: Secondary | ICD-10-CM | POA: Diagnosis not present

## 2019-04-24 DIAGNOSIS — Z880 Allergy status to penicillin: Secondary | ICD-10-CM | POA: Diagnosis not present

## 2019-04-24 DIAGNOSIS — K5090 Crohn's disease, unspecified, without complications: Secondary | ICD-10-CM | POA: Diagnosis not present

## 2019-04-24 DIAGNOSIS — Z6823 Body mass index (BMI) 23.0-23.9, adult: Secondary | ICD-10-CM | POA: Diagnosis not present

## 2019-04-24 DIAGNOSIS — Z882 Allergy status to sulfonamides status: Secondary | ICD-10-CM | POA: Diagnosis not present

## 2019-04-24 DIAGNOSIS — K50018 Crohn's disease of small intestine with other complication: Secondary | ICD-10-CM | POA: Diagnosis not present

## 2019-04-24 DIAGNOSIS — E43 Unspecified severe protein-calorie malnutrition: Secondary | ICD-10-CM | POA: Diagnosis present

## 2019-04-24 DIAGNOSIS — Z452 Encounter for adjustment and management of vascular access device: Secondary | ICD-10-CM | POA: Diagnosis not present

## 2019-04-24 DIAGNOSIS — K50012 Crohn's disease of small intestine with intestinal obstruction: Secondary | ICD-10-CM | POA: Diagnosis not present

## 2019-05-05 DIAGNOSIS — Z48815 Encounter for surgical aftercare following surgery on the digestive system: Secondary | ICD-10-CM | POA: Diagnosis not present

## 2019-05-05 DIAGNOSIS — Z432 Encounter for attention to ileostomy: Secondary | ICD-10-CM | POA: Diagnosis not present

## 2019-05-05 DIAGNOSIS — K5090 Crohn's disease, unspecified, without complications: Secondary | ICD-10-CM | POA: Diagnosis not present

## 2019-05-06 DIAGNOSIS — Z48815 Encounter for surgical aftercare following surgery on the digestive system: Secondary | ICD-10-CM | POA: Diagnosis not present

## 2019-05-06 DIAGNOSIS — Z432 Encounter for attention to ileostomy: Secondary | ICD-10-CM | POA: Diagnosis not present

## 2019-05-06 DIAGNOSIS — K5090 Crohn's disease, unspecified, without complications: Secondary | ICD-10-CM | POA: Diagnosis not present

## 2019-05-07 DIAGNOSIS — R39198 Other difficulties with micturition: Secondary | ICD-10-CM | POA: Diagnosis not present

## 2019-05-08 DIAGNOSIS — Z48815 Encounter for surgical aftercare following surgery on the digestive system: Secondary | ICD-10-CM | POA: Diagnosis not present

## 2019-05-08 DIAGNOSIS — Z432 Encounter for attention to ileostomy: Secondary | ICD-10-CM | POA: Diagnosis not present

## 2019-05-08 DIAGNOSIS — K5090 Crohn's disease, unspecified, without complications: Secondary | ICD-10-CM | POA: Diagnosis not present

## 2019-05-11 DIAGNOSIS — Z432 Encounter for attention to ileostomy: Secondary | ICD-10-CM | POA: Diagnosis not present

## 2019-05-11 DIAGNOSIS — K5090 Crohn's disease, unspecified, without complications: Secondary | ICD-10-CM | POA: Diagnosis not present

## 2019-05-11 DIAGNOSIS — Z48815 Encounter for surgical aftercare following surgery on the digestive system: Secondary | ICD-10-CM | POA: Diagnosis not present

## 2019-05-14 DIAGNOSIS — Z48815 Encounter for surgical aftercare following surgery on the digestive system: Secondary | ICD-10-CM | POA: Diagnosis not present

## 2019-05-14 DIAGNOSIS — K5090 Crohn's disease, unspecified, without complications: Secondary | ICD-10-CM | POA: Diagnosis not present

## 2019-05-14 DIAGNOSIS — Z432 Encounter for attention to ileostomy: Secondary | ICD-10-CM | POA: Diagnosis not present

## 2019-05-15 DIAGNOSIS — Z09 Encounter for follow-up examination after completed treatment for conditions other than malignant neoplasm: Secondary | ICD-10-CM | POA: Diagnosis not present

## 2019-05-15 DIAGNOSIS — K50919 Crohn's disease, unspecified, with unspecified complications: Secondary | ICD-10-CM | POA: Diagnosis not present

## 2019-05-22 DIAGNOSIS — K50919 Crohn's disease, unspecified, with unspecified complications: Secondary | ICD-10-CM | POA: Diagnosis not present

## 2019-05-22 DIAGNOSIS — Z9049 Acquired absence of other specified parts of digestive tract: Secondary | ICD-10-CM | POA: Diagnosis not present

## 2019-05-22 DIAGNOSIS — Z48815 Encounter for surgical aftercare following surgery on the digestive system: Secondary | ICD-10-CM | POA: Diagnosis not present

## 2019-05-22 DIAGNOSIS — Z452 Encounter for adjustment and management of vascular access device: Secondary | ICD-10-CM | POA: Diagnosis not present

## 2019-05-22 DIAGNOSIS — Z432 Encounter for attention to ileostomy: Secondary | ICD-10-CM | POA: Diagnosis not present

## 2019-05-24 DIAGNOSIS — Z48815 Encounter for surgical aftercare following surgery on the digestive system: Secondary | ICD-10-CM | POA: Diagnosis not present

## 2019-05-24 DIAGNOSIS — Z432 Encounter for attention to ileostomy: Secondary | ICD-10-CM | POA: Diagnosis not present

## 2019-05-24 DIAGNOSIS — K5090 Crohn's disease, unspecified, without complications: Secondary | ICD-10-CM | POA: Diagnosis not present

## 2019-05-25 DIAGNOSIS — Z452 Encounter for adjustment and management of vascular access device: Secondary | ICD-10-CM | POA: Diagnosis not present

## 2019-05-25 DIAGNOSIS — Z432 Encounter for attention to ileostomy: Secondary | ICD-10-CM | POA: Diagnosis not present

## 2019-05-25 DIAGNOSIS — Z48815 Encounter for surgical aftercare following surgery on the digestive system: Secondary | ICD-10-CM | POA: Diagnosis not present

## 2019-05-25 DIAGNOSIS — Z9049 Acquired absence of other specified parts of digestive tract: Secondary | ICD-10-CM | POA: Diagnosis not present

## 2019-05-25 DIAGNOSIS — K50919 Crohn's disease, unspecified, with unspecified complications: Secondary | ICD-10-CM | POA: Diagnosis not present

## 2019-05-29 DIAGNOSIS — Z432 Encounter for attention to ileostomy: Secondary | ICD-10-CM | POA: Diagnosis not present

## 2019-05-29 DIAGNOSIS — K50919 Crohn's disease, unspecified, with unspecified complications: Secondary | ICD-10-CM | POA: Diagnosis not present

## 2019-05-29 DIAGNOSIS — Z452 Encounter for adjustment and management of vascular access device: Secondary | ICD-10-CM | POA: Diagnosis not present

## 2019-05-29 DIAGNOSIS — Z48815 Encounter for surgical aftercare following surgery on the digestive system: Secondary | ICD-10-CM | POA: Diagnosis not present

## 2019-05-29 DIAGNOSIS — Z9049 Acquired absence of other specified parts of digestive tract: Secondary | ICD-10-CM | POA: Diagnosis not present

## 2019-06-01 DIAGNOSIS — Z432 Encounter for attention to ileostomy: Secondary | ICD-10-CM | POA: Diagnosis not present

## 2019-06-01 DIAGNOSIS — Z9049 Acquired absence of other specified parts of digestive tract: Secondary | ICD-10-CM | POA: Diagnosis not present

## 2019-06-01 DIAGNOSIS — Z452 Encounter for adjustment and management of vascular access device: Secondary | ICD-10-CM | POA: Diagnosis not present

## 2019-06-01 DIAGNOSIS — Z48815 Encounter for surgical aftercare following surgery on the digestive system: Secondary | ICD-10-CM | POA: Diagnosis not present

## 2019-06-01 DIAGNOSIS — K50919 Crohn's disease, unspecified, with unspecified complications: Secondary | ICD-10-CM | POA: Diagnosis not present

## 2019-06-05 DIAGNOSIS — K50919 Crohn's disease, unspecified, with unspecified complications: Secondary | ICD-10-CM | POA: Diagnosis not present

## 2019-06-08 DIAGNOSIS — Z452 Encounter for adjustment and management of vascular access device: Secondary | ICD-10-CM | POA: Diagnosis not present

## 2019-06-08 DIAGNOSIS — Z9049 Acquired absence of other specified parts of digestive tract: Secondary | ICD-10-CM | POA: Diagnosis not present

## 2019-06-08 DIAGNOSIS — Z48815 Encounter for surgical aftercare following surgery on the digestive system: Secondary | ICD-10-CM | POA: Diagnosis not present

## 2019-06-08 DIAGNOSIS — K50919 Crohn's disease, unspecified, with unspecified complications: Secondary | ICD-10-CM | POA: Diagnosis not present

## 2019-06-08 DIAGNOSIS — Z432 Encounter for attention to ileostomy: Secondary | ICD-10-CM | POA: Diagnosis not present

## 2019-06-09 DIAGNOSIS — Z452 Encounter for adjustment and management of vascular access device: Secondary | ICD-10-CM | POA: Diagnosis not present

## 2019-06-09 DIAGNOSIS — Z9049 Acquired absence of other specified parts of digestive tract: Secondary | ICD-10-CM | POA: Diagnosis not present

## 2019-06-09 DIAGNOSIS — Z432 Encounter for attention to ileostomy: Secondary | ICD-10-CM | POA: Diagnosis not present

## 2019-06-09 DIAGNOSIS — Z48815 Encounter for surgical aftercare following surgery on the digestive system: Secondary | ICD-10-CM | POA: Diagnosis not present

## 2019-06-09 DIAGNOSIS — K50919 Crohn's disease, unspecified, with unspecified complications: Secondary | ICD-10-CM | POA: Diagnosis not present

## 2019-06-12 DIAGNOSIS — Z432 Encounter for attention to ileostomy: Secondary | ICD-10-CM | POA: Diagnosis not present

## 2019-06-12 DIAGNOSIS — Z9049 Acquired absence of other specified parts of digestive tract: Secondary | ICD-10-CM | POA: Diagnosis not present

## 2019-06-12 DIAGNOSIS — Z452 Encounter for adjustment and management of vascular access device: Secondary | ICD-10-CM | POA: Diagnosis not present

## 2019-06-12 DIAGNOSIS — K50919 Crohn's disease, unspecified, with unspecified complications: Secondary | ICD-10-CM | POA: Diagnosis not present

## 2019-06-12 DIAGNOSIS — Z48815 Encounter for surgical aftercare following surgery on the digestive system: Secondary | ICD-10-CM | POA: Diagnosis not present

## 2019-06-15 DIAGNOSIS — Z452 Encounter for adjustment and management of vascular access device: Secondary | ICD-10-CM | POA: Diagnosis not present

## 2019-06-15 DIAGNOSIS — K50919 Crohn's disease, unspecified, with unspecified complications: Secondary | ICD-10-CM | POA: Diagnosis not present

## 2019-06-15 DIAGNOSIS — Z48815 Encounter for surgical aftercare following surgery on the digestive system: Secondary | ICD-10-CM | POA: Diagnosis not present

## 2019-06-15 DIAGNOSIS — Z9049 Acquired absence of other specified parts of digestive tract: Secondary | ICD-10-CM | POA: Diagnosis not present

## 2019-06-15 DIAGNOSIS — Z432 Encounter for attention to ileostomy: Secondary | ICD-10-CM | POA: Diagnosis not present

## 2019-06-19 DIAGNOSIS — I10 Essential (primary) hypertension: Secondary | ICD-10-CM | POA: Diagnosis not present

## 2019-06-19 DIAGNOSIS — Z48815 Encounter for surgical aftercare following surgery on the digestive system: Secondary | ICD-10-CM | POA: Diagnosis not present

## 2019-06-19 DIAGNOSIS — K50919 Crohn's disease, unspecified, with unspecified complications: Secondary | ICD-10-CM | POA: Diagnosis not present

## 2019-06-19 DIAGNOSIS — Z7984 Long term (current) use of oral hypoglycemic drugs: Secondary | ICD-10-CM | POA: Diagnosis not present

## 2019-06-19 DIAGNOSIS — Z9049 Acquired absence of other specified parts of digestive tract: Secondary | ICD-10-CM | POA: Diagnosis not present

## 2019-06-19 DIAGNOSIS — K5000 Crohn's disease of small intestine without complications: Secondary | ICD-10-CM | POA: Diagnosis not present

## 2019-06-19 DIAGNOSIS — D649 Anemia, unspecified: Secondary | ICD-10-CM | POA: Diagnosis not present

## 2019-06-19 DIAGNOSIS — Z432 Encounter for attention to ileostomy: Secondary | ICD-10-CM | POA: Diagnosis not present

## 2019-06-19 DIAGNOSIS — Z452 Encounter for adjustment and management of vascular access device: Secondary | ICD-10-CM | POA: Diagnosis not present

## 2019-06-19 DIAGNOSIS — E1169 Type 2 diabetes mellitus with other specified complication: Secondary | ICD-10-CM | POA: Diagnosis not present

## 2019-06-21 DIAGNOSIS — K5090 Crohn's disease, unspecified, without complications: Secondary | ICD-10-CM | POA: Diagnosis not present

## 2019-06-26 DIAGNOSIS — K50919 Crohn's disease, unspecified, with unspecified complications: Secondary | ICD-10-CM | POA: Diagnosis not present

## 2019-07-17 DIAGNOSIS — I10 Essential (primary) hypertension: Secondary | ICD-10-CM | POA: Diagnosis not present

## 2019-07-17 DIAGNOSIS — E1169 Type 2 diabetes mellitus with other specified complication: Secondary | ICD-10-CM | POA: Diagnosis not present

## 2019-07-17 DIAGNOSIS — E7800 Pure hypercholesterolemia, unspecified: Secondary | ICD-10-CM | POA: Diagnosis not present

## 2019-07-17 DIAGNOSIS — D509 Iron deficiency anemia, unspecified: Secondary | ICD-10-CM | POA: Diagnosis not present

## 2019-07-17 DIAGNOSIS — N400 Enlarged prostate without lower urinary tract symptoms: Secondary | ICD-10-CM | POA: Diagnosis not present

## 2019-07-17 DIAGNOSIS — J4520 Mild intermittent asthma, uncomplicated: Secondary | ICD-10-CM | POA: Diagnosis not present

## 2019-08-09 DIAGNOSIS — R55 Syncope and collapse: Secondary | ICD-10-CM | POA: Diagnosis present

## 2019-08-09 DIAGNOSIS — Z882 Allergy status to sulfonamides status: Secondary | ICD-10-CM | POA: Diagnosis not present

## 2019-08-09 DIAGNOSIS — Z880 Allergy status to penicillin: Secondary | ICD-10-CM | POA: Diagnosis not present

## 2019-08-09 DIAGNOSIS — N289 Disorder of kidney and ureter, unspecified: Secondary | ICD-10-CM | POA: Diagnosis present

## 2019-08-09 DIAGNOSIS — K50919 Crohn's disease, unspecified, with unspecified complications: Secondary | ICD-10-CM | POA: Diagnosis not present

## 2019-08-09 DIAGNOSIS — E860 Dehydration: Secondary | ICD-10-CM | POA: Diagnosis present

## 2019-08-09 DIAGNOSIS — K5090 Crohn's disease, unspecified, without complications: Secondary | ICD-10-CM | POA: Diagnosis not present

## 2019-08-09 DIAGNOSIS — I454 Nonspecific intraventricular block: Secondary | ICD-10-CM | POA: Diagnosis present

## 2019-08-09 DIAGNOSIS — H5704 Mydriasis: Secondary | ICD-10-CM | POA: Diagnosis present

## 2019-08-09 DIAGNOSIS — K50019 Crohn's disease of small intestine with unspecified complications: Secondary | ICD-10-CM | POA: Diagnosis present

## 2019-08-24 DIAGNOSIS — J4520 Mild intermittent asthma, uncomplicated: Secondary | ICD-10-CM | POA: Diagnosis not present

## 2019-08-24 DIAGNOSIS — I10 Essential (primary) hypertension: Secondary | ICD-10-CM | POA: Diagnosis not present

## 2019-08-24 DIAGNOSIS — E7800 Pure hypercholesterolemia, unspecified: Secondary | ICD-10-CM | POA: Diagnosis not present

## 2019-08-24 DIAGNOSIS — E1169 Type 2 diabetes mellitus with other specified complication: Secondary | ICD-10-CM | POA: Diagnosis not present

## 2019-08-24 DIAGNOSIS — D509 Iron deficiency anemia, unspecified: Secondary | ICD-10-CM | POA: Diagnosis not present

## 2019-08-24 DIAGNOSIS — N400 Enlarged prostate without lower urinary tract symptoms: Secondary | ICD-10-CM | POA: Diagnosis not present

## 2019-09-05 DIAGNOSIS — Z9889 Other specified postprocedural states: Secondary | ICD-10-CM | POA: Diagnosis not present

## 2019-09-05 DIAGNOSIS — Z09 Encounter for follow-up examination after completed treatment for conditions other than malignant neoplasm: Secondary | ICD-10-CM | POA: Diagnosis not present

## 2019-09-14 ENCOUNTER — Other Ambulatory Visit: Payer: PRIVATE HEALTH INSURANCE

## 2019-09-25 DIAGNOSIS — K50919 Crohn's disease, unspecified, with unspecified complications: Secondary | ICD-10-CM | POA: Diagnosis not present

## 2019-09-30 DIAGNOSIS — Z23 Encounter for immunization: Secondary | ICD-10-CM | POA: Diagnosis not present

## 2019-10-08 DIAGNOSIS — M47816 Spondylosis without myelopathy or radiculopathy, lumbar region: Secondary | ICD-10-CM | POA: Diagnosis not present

## 2019-10-08 DIAGNOSIS — M62830 Muscle spasm of back: Secondary | ICD-10-CM | POA: Diagnosis not present

## 2019-10-08 DIAGNOSIS — M9903 Segmental and somatic dysfunction of lumbar region: Secondary | ICD-10-CM | POA: Diagnosis not present

## 2019-10-08 DIAGNOSIS — M9901 Segmental and somatic dysfunction of cervical region: Secondary | ICD-10-CM | POA: Diagnosis not present

## 2019-10-08 DIAGNOSIS — M5412 Radiculopathy, cervical region: Secondary | ICD-10-CM | POA: Diagnosis not present

## 2019-10-08 DIAGNOSIS — M9902 Segmental and somatic dysfunction of thoracic region: Secondary | ICD-10-CM | POA: Diagnosis not present

## 2019-10-10 DIAGNOSIS — M5412 Radiculopathy, cervical region: Secondary | ICD-10-CM | POA: Diagnosis not present

## 2019-10-10 DIAGNOSIS — M9903 Segmental and somatic dysfunction of lumbar region: Secondary | ICD-10-CM | POA: Diagnosis not present

## 2019-10-10 DIAGNOSIS — M9901 Segmental and somatic dysfunction of cervical region: Secondary | ICD-10-CM | POA: Diagnosis not present

## 2019-10-10 DIAGNOSIS — M62830 Muscle spasm of back: Secondary | ICD-10-CM | POA: Diagnosis not present

## 2019-10-10 DIAGNOSIS — M47816 Spondylosis without myelopathy or radiculopathy, lumbar region: Secondary | ICD-10-CM | POA: Diagnosis not present

## 2019-10-10 DIAGNOSIS — M9902 Segmental and somatic dysfunction of thoracic region: Secondary | ICD-10-CM | POA: Diagnosis not present

## 2019-10-15 DIAGNOSIS — M9901 Segmental and somatic dysfunction of cervical region: Secondary | ICD-10-CM | POA: Diagnosis not present

## 2019-10-15 DIAGNOSIS — M47816 Spondylosis without myelopathy or radiculopathy, lumbar region: Secondary | ICD-10-CM | POA: Diagnosis not present

## 2019-10-15 DIAGNOSIS — M5412 Radiculopathy, cervical region: Secondary | ICD-10-CM | POA: Diagnosis not present

## 2019-10-15 DIAGNOSIS — M9903 Segmental and somatic dysfunction of lumbar region: Secondary | ICD-10-CM | POA: Diagnosis not present

## 2019-10-15 DIAGNOSIS — M9902 Segmental and somatic dysfunction of thoracic region: Secondary | ICD-10-CM | POA: Diagnosis not present

## 2019-10-15 DIAGNOSIS — M62830 Muscle spasm of back: Secondary | ICD-10-CM | POA: Diagnosis not present

## 2019-10-17 DIAGNOSIS — M9902 Segmental and somatic dysfunction of thoracic region: Secondary | ICD-10-CM | POA: Diagnosis not present

## 2019-10-17 DIAGNOSIS — M5412 Radiculopathy, cervical region: Secondary | ICD-10-CM | POA: Diagnosis not present

## 2019-10-17 DIAGNOSIS — M9903 Segmental and somatic dysfunction of lumbar region: Secondary | ICD-10-CM | POA: Diagnosis not present

## 2019-10-17 DIAGNOSIS — M62830 Muscle spasm of back: Secondary | ICD-10-CM | POA: Diagnosis not present

## 2019-10-17 DIAGNOSIS — M47816 Spondylosis without myelopathy or radiculopathy, lumbar region: Secondary | ICD-10-CM | POA: Diagnosis not present

## 2019-10-17 DIAGNOSIS — M9901 Segmental and somatic dysfunction of cervical region: Secondary | ICD-10-CM | POA: Diagnosis not present

## 2019-10-22 DIAGNOSIS — M47816 Spondylosis without myelopathy or radiculopathy, lumbar region: Secondary | ICD-10-CM | POA: Diagnosis not present

## 2019-10-22 DIAGNOSIS — M62830 Muscle spasm of back: Secondary | ICD-10-CM | POA: Diagnosis not present

## 2019-10-22 DIAGNOSIS — M9903 Segmental and somatic dysfunction of lumbar region: Secondary | ICD-10-CM | POA: Diagnosis not present

## 2019-10-22 DIAGNOSIS — M5412 Radiculopathy, cervical region: Secondary | ICD-10-CM | POA: Diagnosis not present

## 2019-10-22 DIAGNOSIS — M9901 Segmental and somatic dysfunction of cervical region: Secondary | ICD-10-CM | POA: Diagnosis not present

## 2019-10-22 DIAGNOSIS — M9902 Segmental and somatic dysfunction of thoracic region: Secondary | ICD-10-CM | POA: Diagnosis not present

## 2019-10-24 DIAGNOSIS — M9902 Segmental and somatic dysfunction of thoracic region: Secondary | ICD-10-CM | POA: Diagnosis not present

## 2019-10-24 DIAGNOSIS — M9901 Segmental and somatic dysfunction of cervical region: Secondary | ICD-10-CM | POA: Diagnosis not present

## 2019-10-24 DIAGNOSIS — M9903 Segmental and somatic dysfunction of lumbar region: Secondary | ICD-10-CM | POA: Diagnosis not present

## 2019-10-24 DIAGNOSIS — M5412 Radiculopathy, cervical region: Secondary | ICD-10-CM | POA: Diagnosis not present

## 2019-10-24 DIAGNOSIS — M47816 Spondylosis without myelopathy or radiculopathy, lumbar region: Secondary | ICD-10-CM | POA: Diagnosis not present

## 2019-10-24 DIAGNOSIS — M62830 Muscle spasm of back: Secondary | ICD-10-CM | POA: Diagnosis not present

## 2019-10-29 DIAGNOSIS — K219 Gastro-esophageal reflux disease without esophagitis: Secondary | ICD-10-CM | POA: Diagnosis not present

## 2019-10-29 DIAGNOSIS — Z125 Encounter for screening for malignant neoplasm of prostate: Secondary | ICD-10-CM | POA: Diagnosis not present

## 2019-10-29 DIAGNOSIS — E1169 Type 2 diabetes mellitus with other specified complication: Secondary | ICD-10-CM | POA: Diagnosis not present

## 2019-10-29 DIAGNOSIS — E538 Deficiency of other specified B group vitamins: Secondary | ICD-10-CM | POA: Diagnosis not present

## 2019-10-29 DIAGNOSIS — K50019 Crohn's disease of small intestine with unspecified complications: Secondary | ICD-10-CM | POA: Diagnosis not present

## 2019-10-29 DIAGNOSIS — M9903 Segmental and somatic dysfunction of lumbar region: Secondary | ICD-10-CM | POA: Diagnosis not present

## 2019-10-29 DIAGNOSIS — M62830 Muscle spasm of back: Secondary | ICD-10-CM | POA: Diagnosis not present

## 2019-10-29 DIAGNOSIS — M47816 Spondylosis without myelopathy or radiculopathy, lumbar region: Secondary | ICD-10-CM | POA: Diagnosis not present

## 2019-10-29 DIAGNOSIS — I10 Essential (primary) hypertension: Secondary | ICD-10-CM | POA: Diagnosis not present

## 2019-10-29 DIAGNOSIS — Z0000 Encounter for general adult medical examination without abnormal findings: Secondary | ICD-10-CM | POA: Diagnosis not present

## 2019-10-29 DIAGNOSIS — D509 Iron deficiency anemia, unspecified: Secondary | ICD-10-CM | POA: Diagnosis not present

## 2019-10-29 DIAGNOSIS — Z23 Encounter for immunization: Secondary | ICD-10-CM | POA: Diagnosis not present

## 2019-10-29 DIAGNOSIS — M9901 Segmental and somatic dysfunction of cervical region: Secondary | ICD-10-CM | POA: Diagnosis not present

## 2019-10-29 DIAGNOSIS — M5412 Radiculopathy, cervical region: Secondary | ICD-10-CM | POA: Diagnosis not present

## 2019-10-29 DIAGNOSIS — E559 Vitamin D deficiency, unspecified: Secondary | ICD-10-CM | POA: Diagnosis not present

## 2019-10-29 DIAGNOSIS — M9902 Segmental and somatic dysfunction of thoracic region: Secondary | ICD-10-CM | POA: Diagnosis not present

## 2019-10-29 DIAGNOSIS — Z1389 Encounter for screening for other disorder: Secondary | ICD-10-CM | POA: Diagnosis not present

## 2019-10-31 DIAGNOSIS — M47816 Spondylosis without myelopathy or radiculopathy, lumbar region: Secondary | ICD-10-CM | POA: Diagnosis not present

## 2019-10-31 DIAGNOSIS — M9903 Segmental and somatic dysfunction of lumbar region: Secondary | ICD-10-CM | POA: Diagnosis not present

## 2019-10-31 DIAGNOSIS — M9902 Segmental and somatic dysfunction of thoracic region: Secondary | ICD-10-CM | POA: Diagnosis not present

## 2019-10-31 DIAGNOSIS — M9901 Segmental and somatic dysfunction of cervical region: Secondary | ICD-10-CM | POA: Diagnosis not present

## 2019-10-31 DIAGNOSIS — M62830 Muscle spasm of back: Secondary | ICD-10-CM | POA: Diagnosis not present

## 2019-10-31 DIAGNOSIS — M5412 Radiculopathy, cervical region: Secondary | ICD-10-CM | POA: Diagnosis not present

## 2019-11-01 DIAGNOSIS — H52223 Regular astigmatism, bilateral: Secondary | ICD-10-CM | POA: Diagnosis not present

## 2019-11-01 DIAGNOSIS — H5213 Myopia, bilateral: Secondary | ICD-10-CM | POA: Diagnosis not present

## 2019-11-01 DIAGNOSIS — E119 Type 2 diabetes mellitus without complications: Secondary | ICD-10-CM | POA: Diagnosis not present

## 2019-11-01 DIAGNOSIS — H524 Presbyopia: Secondary | ICD-10-CM | POA: Diagnosis not present

## 2019-11-05 DIAGNOSIS — M9901 Segmental and somatic dysfunction of cervical region: Secondary | ICD-10-CM | POA: Diagnosis not present

## 2019-11-05 DIAGNOSIS — M9903 Segmental and somatic dysfunction of lumbar region: Secondary | ICD-10-CM | POA: Diagnosis not present

## 2019-11-05 DIAGNOSIS — M9902 Segmental and somatic dysfunction of thoracic region: Secondary | ICD-10-CM | POA: Diagnosis not present

## 2019-11-05 DIAGNOSIS — M47816 Spondylosis without myelopathy or radiculopathy, lumbar region: Secondary | ICD-10-CM | POA: Diagnosis not present

## 2019-11-05 DIAGNOSIS — M62830 Muscle spasm of back: Secondary | ICD-10-CM | POA: Diagnosis not present

## 2019-11-05 DIAGNOSIS — M5412 Radiculopathy, cervical region: Secondary | ICD-10-CM | POA: Diagnosis not present

## 2019-11-06 DIAGNOSIS — E1169 Type 2 diabetes mellitus with other specified complication: Secondary | ICD-10-CM | POA: Diagnosis not present

## 2019-11-06 DIAGNOSIS — I10 Essential (primary) hypertension: Secondary | ICD-10-CM | POA: Diagnosis not present

## 2019-11-06 DIAGNOSIS — J4520 Mild intermittent asthma, uncomplicated: Secondary | ICD-10-CM | POA: Diagnosis not present

## 2019-11-07 DIAGNOSIS — M5412 Radiculopathy, cervical region: Secondary | ICD-10-CM | POA: Diagnosis not present

## 2019-11-07 DIAGNOSIS — M9902 Segmental and somatic dysfunction of thoracic region: Secondary | ICD-10-CM | POA: Diagnosis not present

## 2019-11-07 DIAGNOSIS — M9903 Segmental and somatic dysfunction of lumbar region: Secondary | ICD-10-CM | POA: Diagnosis not present

## 2019-11-07 DIAGNOSIS — M47816 Spondylosis without myelopathy or radiculopathy, lumbar region: Secondary | ICD-10-CM | POA: Diagnosis not present

## 2019-11-07 DIAGNOSIS — M62830 Muscle spasm of back: Secondary | ICD-10-CM | POA: Diagnosis not present

## 2019-11-07 DIAGNOSIS — M9901 Segmental and somatic dysfunction of cervical region: Secondary | ICD-10-CM | POA: Diagnosis not present

## 2019-11-12 DIAGNOSIS — M9903 Segmental and somatic dysfunction of lumbar region: Secondary | ICD-10-CM | POA: Diagnosis not present

## 2019-11-12 DIAGNOSIS — M9901 Segmental and somatic dysfunction of cervical region: Secondary | ICD-10-CM | POA: Diagnosis not present

## 2019-11-12 DIAGNOSIS — M9902 Segmental and somatic dysfunction of thoracic region: Secondary | ICD-10-CM | POA: Diagnosis not present

## 2019-11-12 DIAGNOSIS — I10 Essential (primary) hypertension: Secondary | ICD-10-CM | POA: Diagnosis not present

## 2019-11-12 DIAGNOSIS — M47816 Spondylosis without myelopathy or radiculopathy, lumbar region: Secondary | ICD-10-CM | POA: Diagnosis not present

## 2019-11-12 DIAGNOSIS — M5412 Radiculopathy, cervical region: Secondary | ICD-10-CM | POA: Diagnosis not present

## 2019-11-12 DIAGNOSIS — M62830 Muscle spasm of back: Secondary | ICD-10-CM | POA: Diagnosis not present

## 2019-11-14 DIAGNOSIS — M9903 Segmental and somatic dysfunction of lumbar region: Secondary | ICD-10-CM | POA: Diagnosis not present

## 2019-11-14 DIAGNOSIS — M9901 Segmental and somatic dysfunction of cervical region: Secondary | ICD-10-CM | POA: Diagnosis not present

## 2019-11-14 DIAGNOSIS — M5412 Radiculopathy, cervical region: Secondary | ICD-10-CM | POA: Diagnosis not present

## 2019-11-14 DIAGNOSIS — M47816 Spondylosis without myelopathy or radiculopathy, lumbar region: Secondary | ICD-10-CM | POA: Diagnosis not present

## 2019-11-14 DIAGNOSIS — M62830 Muscle spasm of back: Secondary | ICD-10-CM | POA: Diagnosis not present

## 2019-11-14 DIAGNOSIS — M9902 Segmental and somatic dysfunction of thoracic region: Secondary | ICD-10-CM | POA: Diagnosis not present

## 2019-11-19 DIAGNOSIS — M9901 Segmental and somatic dysfunction of cervical region: Secondary | ICD-10-CM | POA: Diagnosis not present

## 2019-11-19 DIAGNOSIS — M9903 Segmental and somatic dysfunction of lumbar region: Secondary | ICD-10-CM | POA: Diagnosis not present

## 2019-11-19 DIAGNOSIS — M47816 Spondylosis without myelopathy or radiculopathy, lumbar region: Secondary | ICD-10-CM | POA: Diagnosis not present

## 2019-11-19 DIAGNOSIS — M5412 Radiculopathy, cervical region: Secondary | ICD-10-CM | POA: Diagnosis not present

## 2019-11-19 DIAGNOSIS — M9902 Segmental and somatic dysfunction of thoracic region: Secondary | ICD-10-CM | POA: Diagnosis not present

## 2019-11-19 DIAGNOSIS — M62830 Muscle spasm of back: Secondary | ICD-10-CM | POA: Diagnosis not present

## 2019-11-26 DIAGNOSIS — L570 Actinic keratosis: Secondary | ICD-10-CM | POA: Diagnosis not present

## 2019-11-26 DIAGNOSIS — D044 Carcinoma in situ of skin of scalp and neck: Secondary | ICD-10-CM | POA: Diagnosis not present

## 2019-11-26 DIAGNOSIS — L821 Other seborrheic keratosis: Secondary | ICD-10-CM | POA: Diagnosis not present

## 2019-11-26 DIAGNOSIS — D485 Neoplasm of uncertain behavior of skin: Secondary | ICD-10-CM | POA: Diagnosis not present

## 2019-11-27 DIAGNOSIS — M62830 Muscle spasm of back: Secondary | ICD-10-CM | POA: Diagnosis not present

## 2019-11-27 DIAGNOSIS — M9901 Segmental and somatic dysfunction of cervical region: Secondary | ICD-10-CM | POA: Diagnosis not present

## 2019-11-27 DIAGNOSIS — M9903 Segmental and somatic dysfunction of lumbar region: Secondary | ICD-10-CM | POA: Diagnosis not present

## 2019-11-27 DIAGNOSIS — M47816 Spondylosis without myelopathy or radiculopathy, lumbar region: Secondary | ICD-10-CM | POA: Diagnosis not present

## 2019-11-27 DIAGNOSIS — M5412 Radiculopathy, cervical region: Secondary | ICD-10-CM | POA: Diagnosis not present

## 2019-11-27 DIAGNOSIS — M9902 Segmental and somatic dysfunction of thoracic region: Secondary | ICD-10-CM | POA: Diagnosis not present

## 2019-12-04 DIAGNOSIS — E1169 Type 2 diabetes mellitus with other specified complication: Secondary | ICD-10-CM | POA: Diagnosis not present

## 2019-12-04 DIAGNOSIS — M5412 Radiculopathy, cervical region: Secondary | ICD-10-CM | POA: Diagnosis not present

## 2019-12-04 DIAGNOSIS — M9902 Segmental and somatic dysfunction of thoracic region: Secondary | ICD-10-CM | POA: Diagnosis not present

## 2019-12-04 DIAGNOSIS — M62830 Muscle spasm of back: Secondary | ICD-10-CM | POA: Diagnosis not present

## 2019-12-04 DIAGNOSIS — I10 Essential (primary) hypertension: Secondary | ICD-10-CM | POA: Diagnosis not present

## 2019-12-04 DIAGNOSIS — D509 Iron deficiency anemia, unspecified: Secondary | ICD-10-CM | POA: Diagnosis not present

## 2019-12-04 DIAGNOSIS — M47816 Spondylosis without myelopathy or radiculopathy, lumbar region: Secondary | ICD-10-CM | POA: Diagnosis not present

## 2019-12-04 DIAGNOSIS — N400 Enlarged prostate without lower urinary tract symptoms: Secondary | ICD-10-CM | POA: Diagnosis not present

## 2019-12-04 DIAGNOSIS — E7800 Pure hypercholesterolemia, unspecified: Secondary | ICD-10-CM | POA: Diagnosis not present

## 2019-12-04 DIAGNOSIS — M9903 Segmental and somatic dysfunction of lumbar region: Secondary | ICD-10-CM | POA: Diagnosis not present

## 2019-12-04 DIAGNOSIS — J4520 Mild intermittent asthma, uncomplicated: Secondary | ICD-10-CM | POA: Diagnosis not present

## 2019-12-04 DIAGNOSIS — K219 Gastro-esophageal reflux disease without esophagitis: Secondary | ICD-10-CM | POA: Diagnosis not present

## 2019-12-04 DIAGNOSIS — M9901 Segmental and somatic dysfunction of cervical region: Secondary | ICD-10-CM | POA: Diagnosis not present

## 2019-12-11 DIAGNOSIS — M47816 Spondylosis without myelopathy or radiculopathy, lumbar region: Secondary | ICD-10-CM | POA: Diagnosis not present

## 2019-12-11 DIAGNOSIS — M9903 Segmental and somatic dysfunction of lumbar region: Secondary | ICD-10-CM | POA: Diagnosis not present

## 2019-12-11 DIAGNOSIS — M62830 Muscle spasm of back: Secondary | ICD-10-CM | POA: Diagnosis not present

## 2019-12-11 DIAGNOSIS — M9901 Segmental and somatic dysfunction of cervical region: Secondary | ICD-10-CM | POA: Diagnosis not present

## 2019-12-11 DIAGNOSIS — K50919 Crohn's disease, unspecified, with unspecified complications: Secondary | ICD-10-CM | POA: Diagnosis not present

## 2019-12-11 DIAGNOSIS — M5412 Radiculopathy, cervical region: Secondary | ICD-10-CM | POA: Diagnosis not present

## 2019-12-11 DIAGNOSIS — M9902 Segmental and somatic dysfunction of thoracic region: Secondary | ICD-10-CM | POA: Diagnosis not present

## 2019-12-12 DIAGNOSIS — E538 Deficiency of other specified B group vitamins: Secondary | ICD-10-CM | POA: Diagnosis not present

## 2019-12-19 DIAGNOSIS — M62830 Muscle spasm of back: Secondary | ICD-10-CM | POA: Diagnosis not present

## 2019-12-19 DIAGNOSIS — M9901 Segmental and somatic dysfunction of cervical region: Secondary | ICD-10-CM | POA: Diagnosis not present

## 2019-12-19 DIAGNOSIS — M9903 Segmental and somatic dysfunction of lumbar region: Secondary | ICD-10-CM | POA: Diagnosis not present

## 2019-12-19 DIAGNOSIS — M9902 Segmental and somatic dysfunction of thoracic region: Secondary | ICD-10-CM | POA: Diagnosis not present

## 2019-12-19 DIAGNOSIS — M5412 Radiculopathy, cervical region: Secondary | ICD-10-CM | POA: Diagnosis not present

## 2019-12-19 DIAGNOSIS — M47816 Spondylosis without myelopathy or radiculopathy, lumbar region: Secondary | ICD-10-CM | POA: Diagnosis not present

## 2019-12-26 DIAGNOSIS — M542 Cervicalgia: Secondary | ICD-10-CM | POA: Diagnosis not present

## 2019-12-31 DIAGNOSIS — M542 Cervicalgia: Secondary | ICD-10-CM | POA: Diagnosis not present

## 2020-01-03 DIAGNOSIS — M542 Cervicalgia: Secondary | ICD-10-CM | POA: Diagnosis not present

## 2020-01-14 DIAGNOSIS — M542 Cervicalgia: Secondary | ICD-10-CM | POA: Diagnosis not present

## 2020-01-16 DIAGNOSIS — M542 Cervicalgia: Secondary | ICD-10-CM | POA: Diagnosis not present

## 2020-01-21 DIAGNOSIS — M542 Cervicalgia: Secondary | ICD-10-CM | POA: Diagnosis not present

## 2020-01-22 DIAGNOSIS — K219 Gastro-esophageal reflux disease without esophagitis: Secondary | ICD-10-CM | POA: Diagnosis not present

## 2020-01-22 DIAGNOSIS — D509 Iron deficiency anemia, unspecified: Secondary | ICD-10-CM | POA: Diagnosis not present

## 2020-01-22 DIAGNOSIS — I10 Essential (primary) hypertension: Secondary | ICD-10-CM | POA: Diagnosis not present

## 2020-01-22 DIAGNOSIS — E7800 Pure hypercholesterolemia, unspecified: Secondary | ICD-10-CM | POA: Diagnosis not present

## 2020-01-22 DIAGNOSIS — N400 Enlarged prostate without lower urinary tract symptoms: Secondary | ICD-10-CM | POA: Diagnosis not present

## 2020-01-22 DIAGNOSIS — E1169 Type 2 diabetes mellitus with other specified complication: Secondary | ICD-10-CM | POA: Diagnosis not present

## 2020-01-22 DIAGNOSIS — J4520 Mild intermittent asthma, uncomplicated: Secondary | ICD-10-CM | POA: Diagnosis not present

## 2020-01-23 DIAGNOSIS — M542 Cervicalgia: Secondary | ICD-10-CM | POA: Diagnosis not present

## 2020-01-28 DIAGNOSIS — M542 Cervicalgia: Secondary | ICD-10-CM | POA: Diagnosis not present

## 2020-01-30 DIAGNOSIS — M542 Cervicalgia: Secondary | ICD-10-CM | POA: Diagnosis not present

## 2020-02-20 DIAGNOSIS — Z882 Allergy status to sulfonamides status: Secondary | ICD-10-CM | POA: Diagnosis not present

## 2020-02-20 DIAGNOSIS — K219 Gastro-esophageal reflux disease without esophagitis: Secondary | ICD-10-CM | POA: Diagnosis not present

## 2020-02-20 DIAGNOSIS — K648 Other hemorrhoids: Secondary | ICD-10-CM | POA: Diagnosis not present

## 2020-02-20 DIAGNOSIS — K633 Ulcer of intestine: Secondary | ICD-10-CM | POA: Diagnosis not present

## 2020-02-20 DIAGNOSIS — K5000 Crohn's disease of small intestine without complications: Secondary | ICD-10-CM | POA: Diagnosis not present

## 2020-02-20 DIAGNOSIS — I129 Hypertensive chronic kidney disease with stage 1 through stage 4 chronic kidney disease, or unspecified chronic kidney disease: Secondary | ICD-10-CM | POA: Diagnosis not present

## 2020-02-20 DIAGNOSIS — E119 Type 2 diabetes mellitus without complications: Secondary | ICD-10-CM | POA: Diagnosis not present

## 2020-02-20 DIAGNOSIS — K6289 Other specified diseases of anus and rectum: Secondary | ICD-10-CM | POA: Diagnosis not present

## 2020-02-20 DIAGNOSIS — Z79899 Other long term (current) drug therapy: Secondary | ICD-10-CM | POA: Diagnosis not present

## 2020-02-20 DIAGNOSIS — K644 Residual hemorrhoidal skin tags: Secondary | ICD-10-CM | POA: Diagnosis not present

## 2020-02-20 DIAGNOSIS — K529 Noninfective gastroenteritis and colitis, unspecified: Secondary | ICD-10-CM | POA: Diagnosis not present

## 2020-02-20 DIAGNOSIS — K50919 Crohn's disease, unspecified, with unspecified complications: Secondary | ICD-10-CM | POA: Diagnosis not present

## 2020-02-20 DIAGNOSIS — K50818 Crohn's disease of both small and large intestine with other complication: Secondary | ICD-10-CM | POA: Diagnosis not present

## 2020-02-20 DIAGNOSIS — K5090 Crohn's disease, unspecified, without complications: Secondary | ICD-10-CM | POA: Diagnosis not present

## 2020-02-20 DIAGNOSIS — Z980 Intestinal bypass and anastomosis status: Secondary | ICD-10-CM | POA: Diagnosis not present

## 2020-02-20 DIAGNOSIS — K6389 Other specified diseases of intestine: Secondary | ICD-10-CM | POA: Diagnosis not present

## 2020-02-20 DIAGNOSIS — Z880 Allergy status to penicillin: Secondary | ICD-10-CM | POA: Diagnosis not present

## 2020-02-20 DIAGNOSIS — N189 Chronic kidney disease, unspecified: Secondary | ICD-10-CM | POA: Diagnosis not present

## 2020-04-07 DIAGNOSIS — Z23 Encounter for immunization: Secondary | ICD-10-CM | POA: Diagnosis not present

## 2020-05-01 DIAGNOSIS — I10 Essential (primary) hypertension: Secondary | ICD-10-CM | POA: Diagnosis not present

## 2020-05-01 DIAGNOSIS — K219 Gastro-esophageal reflux disease without esophagitis: Secondary | ICD-10-CM | POA: Diagnosis not present

## 2020-05-01 DIAGNOSIS — E538 Deficiency of other specified B group vitamins: Secondary | ICD-10-CM | POA: Diagnosis not present

## 2020-05-01 DIAGNOSIS — E611 Iron deficiency: Secondary | ICD-10-CM | POA: Diagnosis not present

## 2020-05-01 DIAGNOSIS — E1169 Type 2 diabetes mellitus with other specified complication: Secondary | ICD-10-CM | POA: Diagnosis not present

## 2020-06-17 DIAGNOSIS — I10 Essential (primary) hypertension: Secondary | ICD-10-CM | POA: Diagnosis not present

## 2020-06-17 DIAGNOSIS — K219 Gastro-esophageal reflux disease without esophagitis: Secondary | ICD-10-CM | POA: Diagnosis not present

## 2020-06-17 DIAGNOSIS — E7800 Pure hypercholesterolemia, unspecified: Secondary | ICD-10-CM | POA: Diagnosis not present

## 2020-06-17 DIAGNOSIS — J4520 Mild intermittent asthma, uncomplicated: Secondary | ICD-10-CM | POA: Diagnosis not present

## 2020-06-17 DIAGNOSIS — N400 Enlarged prostate without lower urinary tract symptoms: Secondary | ICD-10-CM | POA: Diagnosis not present

## 2020-06-17 DIAGNOSIS — D509 Iron deficiency anemia, unspecified: Secondary | ICD-10-CM | POA: Diagnosis not present

## 2020-06-17 DIAGNOSIS — E1169 Type 2 diabetes mellitus with other specified complication: Secondary | ICD-10-CM | POA: Diagnosis not present

## 2020-07-18 DIAGNOSIS — Z794 Long term (current) use of insulin: Secondary | ICD-10-CM | POA: Diagnosis not present

## 2020-07-18 DIAGNOSIS — K5000 Crohn's disease of small intestine without complications: Secondary | ICD-10-CM | POA: Diagnosis not present

## 2020-07-18 DIAGNOSIS — E1169 Type 2 diabetes mellitus with other specified complication: Secondary | ICD-10-CM | POA: Diagnosis not present

## 2020-09-04 DIAGNOSIS — N410 Acute prostatitis: Secondary | ICD-10-CM | POA: Diagnosis not present

## 2020-09-04 DIAGNOSIS — R300 Dysuria: Secondary | ICD-10-CM | POA: Diagnosis not present

## 2020-10-09 DIAGNOSIS — Z23 Encounter for immunization: Secondary | ICD-10-CM | POA: Diagnosis not present

## 2020-10-13 DIAGNOSIS — J4520 Mild intermittent asthma, uncomplicated: Secondary | ICD-10-CM | POA: Diagnosis not present

## 2020-10-13 DIAGNOSIS — D509 Iron deficiency anemia, unspecified: Secondary | ICD-10-CM | POA: Diagnosis not present

## 2020-10-13 DIAGNOSIS — I10 Essential (primary) hypertension: Secondary | ICD-10-CM | POA: Diagnosis not present

## 2020-10-13 DIAGNOSIS — K219 Gastro-esophageal reflux disease without esophagitis: Secondary | ICD-10-CM | POA: Diagnosis not present

## 2020-10-13 DIAGNOSIS — E1169 Type 2 diabetes mellitus with other specified complication: Secondary | ICD-10-CM | POA: Diagnosis not present

## 2020-10-13 DIAGNOSIS — N400 Enlarged prostate without lower urinary tract symptoms: Secondary | ICD-10-CM | POA: Diagnosis not present

## 2020-10-13 DIAGNOSIS — E7800 Pure hypercholesterolemia, unspecified: Secondary | ICD-10-CM | POA: Diagnosis not present

## 2020-10-20 DIAGNOSIS — K50919 Crohn's disease, unspecified, with unspecified complications: Secondary | ICD-10-CM | POA: Diagnosis not present

## 2020-10-28 DIAGNOSIS — Z23 Encounter for immunization: Secondary | ICD-10-CM | POA: Diagnosis not present

## 2020-11-26 DIAGNOSIS — D225 Melanocytic nevi of trunk: Secondary | ICD-10-CM | POA: Diagnosis not present

## 2020-11-26 DIAGNOSIS — L570 Actinic keratosis: Secondary | ICD-10-CM | POA: Diagnosis not present

## 2020-11-26 DIAGNOSIS — L821 Other seborrheic keratosis: Secondary | ICD-10-CM | POA: Diagnosis not present

## 2020-11-26 DIAGNOSIS — D2262 Melanocytic nevi of left upper limb, including shoulder: Secondary | ICD-10-CM | POA: Diagnosis not present

## 2020-11-26 DIAGNOSIS — L720 Epidermal cyst: Secondary | ICD-10-CM | POA: Diagnosis not present

## 2020-11-26 DIAGNOSIS — Z85828 Personal history of other malignant neoplasm of skin: Secondary | ICD-10-CM | POA: Diagnosis not present

## 2020-11-26 DIAGNOSIS — L812 Freckles: Secondary | ICD-10-CM | POA: Diagnosis not present

## 2020-12-17 DIAGNOSIS — N400 Enlarged prostate without lower urinary tract symptoms: Secondary | ICD-10-CM | POA: Diagnosis not present

## 2020-12-17 DIAGNOSIS — Z0000 Encounter for general adult medical examination without abnormal findings: Secondary | ICD-10-CM | POA: Diagnosis not present

## 2020-12-17 DIAGNOSIS — R609 Edema, unspecified: Secondary | ICD-10-CM | POA: Diagnosis not present

## 2020-12-17 DIAGNOSIS — I10 Essential (primary) hypertension: Secondary | ICD-10-CM | POA: Diagnosis not present

## 2020-12-17 DIAGNOSIS — K219 Gastro-esophageal reflux disease without esophagitis: Secondary | ICD-10-CM | POA: Diagnosis not present

## 2020-12-17 DIAGNOSIS — E1169 Type 2 diabetes mellitus with other specified complication: Secondary | ICD-10-CM | POA: Diagnosis not present

## 2020-12-17 DIAGNOSIS — K50019 Crohn's disease of small intestine with unspecified complications: Secondary | ICD-10-CM | POA: Diagnosis not present

## 2021-01-01 DIAGNOSIS — H524 Presbyopia: Secondary | ICD-10-CM | POA: Diagnosis not present

## 2021-01-01 DIAGNOSIS — H25813 Combined forms of age-related cataract, bilateral: Secondary | ICD-10-CM | POA: Diagnosis not present

## 2021-01-01 DIAGNOSIS — H43813 Vitreous degeneration, bilateral: Secondary | ICD-10-CM | POA: Diagnosis not present

## 2021-01-01 DIAGNOSIS — H5213 Myopia, bilateral: Secondary | ICD-10-CM | POA: Diagnosis not present

## 2021-01-01 DIAGNOSIS — E119 Type 2 diabetes mellitus without complications: Secondary | ICD-10-CM | POA: Diagnosis not present

## 2021-01-01 DIAGNOSIS — H52223 Regular astigmatism, bilateral: Secondary | ICD-10-CM | POA: Diagnosis not present

## 2021-01-11 DIAGNOSIS — N400 Enlarged prostate without lower urinary tract symptoms: Secondary | ICD-10-CM | POA: Diagnosis not present

## 2021-01-11 DIAGNOSIS — D509 Iron deficiency anemia, unspecified: Secondary | ICD-10-CM | POA: Diagnosis not present

## 2021-01-11 DIAGNOSIS — J4520 Mild intermittent asthma, uncomplicated: Secondary | ICD-10-CM | POA: Diagnosis not present

## 2021-01-11 DIAGNOSIS — I10 Essential (primary) hypertension: Secondary | ICD-10-CM | POA: Diagnosis not present

## 2021-01-11 DIAGNOSIS — E7800 Pure hypercholesterolemia, unspecified: Secondary | ICD-10-CM | POA: Diagnosis not present

## 2021-01-11 DIAGNOSIS — K219 Gastro-esophageal reflux disease without esophagitis: Secondary | ICD-10-CM | POA: Diagnosis not present

## 2021-01-11 DIAGNOSIS — E1169 Type 2 diabetes mellitus with other specified complication: Secondary | ICD-10-CM | POA: Diagnosis not present

## 2021-01-14 DIAGNOSIS — Z5181 Encounter for therapeutic drug level monitoring: Secondary | ICD-10-CM | POA: Diagnosis not present

## 2021-01-14 DIAGNOSIS — Z79899 Other long term (current) drug therapy: Secondary | ICD-10-CM | POA: Diagnosis not present

## 2021-03-31 DIAGNOSIS — K50819 Crohn's disease of both small and large intestine with unspecified complications: Secondary | ICD-10-CM | POA: Diagnosis not present

## 2021-03-31 DIAGNOSIS — K50919 Crohn's disease, unspecified, with unspecified complications: Secondary | ICD-10-CM | POA: Diagnosis not present

## 2021-04-06 ENCOUNTER — Emergency Department (HOSPITAL_COMMUNITY)
Admission: EM | Admit: 2021-04-06 | Discharge: 2021-04-06 | Disposition: A | Discharge status: Home / Self Care | Payer: Medicare Other | Attending: Emergency Medicine | Admitting: Emergency Medicine

## 2021-04-06 ENCOUNTER — Encounter (HOSPITAL_COMMUNITY): Payer: Self-pay

## 2021-04-06 ENCOUNTER — Emergency Department (HOSPITAL_COMMUNITY): Payer: Medicare Other

## 2021-04-06 DIAGNOSIS — N200 Calculus of kidney: Secondary | ICD-10-CM

## 2021-04-06 DIAGNOSIS — I10 Essential (primary) hypertension: Secondary | ICD-10-CM | POA: Diagnosis not present

## 2021-04-06 DIAGNOSIS — Z79899 Other long term (current) drug therapy: Secondary | ICD-10-CM | POA: Diagnosis not present

## 2021-04-06 DIAGNOSIS — E119 Type 2 diabetes mellitus without complications: Secondary | ICD-10-CM | POA: Insufficient documentation

## 2021-04-06 DIAGNOSIS — K3189 Other diseases of stomach and duodenum: Secondary | ICD-10-CM | POA: Diagnosis not present

## 2021-04-06 DIAGNOSIS — N132 Hydronephrosis with renal and ureteral calculous obstruction: Secondary | ICD-10-CM | POA: Insufficient documentation

## 2021-04-06 DIAGNOSIS — K6389 Other specified diseases of intestine: Secondary | ICD-10-CM | POA: Diagnosis not present

## 2021-04-06 DIAGNOSIS — N281 Cyst of kidney, acquired: Secondary | ICD-10-CM | POA: Diagnosis not present

## 2021-04-06 DIAGNOSIS — R1011 Right upper quadrant pain: Secondary | ICD-10-CM | POA: Diagnosis present

## 2021-04-06 LAB — CBC WITH DIFFERENTIAL/PLATELET
Abs Immature Granulocytes: 0.05 10*3/uL (ref 0.00–0.07)
Basophils Absolute: 0 10*3/uL (ref 0.0–0.1)
Basophils Relative: 0 %
Eosinophils Absolute: 0.1 10*3/uL (ref 0.0–0.5)
Eosinophils Relative: 1 %
HCT: 37 % — ABNORMAL LOW (ref 39.0–52.0)
Hemoglobin: 12.8 g/dL — ABNORMAL LOW (ref 13.0–17.0)
Immature Granulocytes: 1 %
Lymphocytes Relative: 7 %
Lymphs Abs: 0.6 10*3/uL — ABNORMAL LOW (ref 0.7–4.0)
MCH: 31.2 pg (ref 26.0–34.0)
MCHC: 34.6 g/dL (ref 30.0–36.0)
MCV: 90.2 fL (ref 80.0–100.0)
Monocytes Absolute: 0.6 10*3/uL (ref 0.1–1.0)
Monocytes Relative: 6 %
Neutro Abs: 8.1 10*3/uL — ABNORMAL HIGH (ref 1.7–7.7)
Neutrophils Relative %: 85 %
Platelets: 172 10*3/uL (ref 150–400)
RBC: 4.1 MIL/uL — ABNORMAL LOW (ref 4.22–5.81)
RDW: 12.3 % (ref 11.5–15.5)
WBC: 9.5 10*3/uL (ref 4.0–10.5)
nRBC: 0 % (ref 0.0–0.2)

## 2021-04-06 LAB — URINALYSIS, ROUTINE W REFLEX MICROSCOPIC
Bacteria, UA: NONE SEEN
Bilirubin Urine: NEGATIVE
Glucose, UA: >=500 mg/dL — AB
Hgb urine dipstick: NEGATIVE
Ketones, ur: 5 mg/dL — AB
Leukocytes,Ua: NEGATIVE
Nitrite: NEGATIVE
Protein, ur: NEGATIVE mg/dL
Specific Gravity, Urine: 1.01 (ref 1.005–1.030)
pH: 6 (ref 5.0–8.0)

## 2021-04-06 LAB — COMPREHENSIVE METABOLIC PANEL
ALT: 36 U/L (ref 0–44)
AST: 40 U/L (ref 15–41)
Albumin: 4.8 g/dL (ref 3.5–5.0)
Alkaline Phosphatase: 59 U/L (ref 38–126)
Anion gap: 11 (ref 5–15)
BUN: 19 mg/dL (ref 8–23)
CO2: 24 mmol/L (ref 22–32)
Calcium: 9.1 mg/dL (ref 8.9–10.3)
Chloride: 101 mmol/L (ref 98–111)
Creatinine, Ser: 1.33 mg/dL — ABNORMAL HIGH (ref 0.61–1.24)
GFR, Estimated: 58 mL/min — ABNORMAL LOW (ref >60)
Glucose, Bld: 282 mg/dL — ABNORMAL HIGH (ref 70–99)
Potassium: 4 mmol/L (ref 3.5–5.1)
Sodium: 136 mmol/L (ref 135–145)
Total Bilirubin: 1.1 mg/dL (ref 0.3–1.2)
Total Protein: 8.2 g/dL — ABNORMAL HIGH (ref 6.5–8.1)

## 2021-04-06 LAB — LIPASE, BLOOD: Lipase: 25 U/L (ref 11–51)

## 2021-04-06 MED ORDER — ONDANSETRON HCL 4 MG/2ML IJ SOLN
4.0000 mg | Freq: Once | INTRAMUSCULAR | Status: AC
  Administered 2021-04-06: 4 mg via INTRAVENOUS
  Filled 2021-04-06: qty 2

## 2021-04-06 MED ORDER — OXYCODONE-ACETAMINOPHEN 5-325 MG PO TABS: 1.0000 | ORAL_TABLET | Freq: Four times a day (QID) | ORAL | 0 refills | Status: DC | PRN

## 2021-04-06 MED ORDER — ONDANSETRON HCL 4 MG PO TABS: 4.0000 mg | ORAL_TABLET | Freq: Four times a day (QID) | ORAL | 0 refills | Status: AC

## 2021-04-06 MED ORDER — HYDROMORPHONE HCL 1 MG/ML IJ SOLN
1.0000 mg | Freq: Once | INTRAMUSCULAR | Status: AC
  Administered 2021-04-06: 1 mg via INTRAVENOUS
  Filled 2021-04-06: qty 1

## 2021-04-06 MED ORDER — SODIUM CHLORIDE 0.9 % IV BOLUS
1000.0000 mL | Freq: Once | INTRAVENOUS | Status: AC
  Administered 2021-04-06: 1000 mL via INTRAVENOUS

## 2021-04-06 NOTE — ED Notes (Addendum)
Attempted obtaining patients temperature oral and axillary, but unsuccessful. Will try again in a few minutes. ?

## 2021-04-06 NOTE — ED Triage Notes (Signed)
Pt presents via POV for right flank pain radiating to the RUQ which he stated began suddenly at 0300 this morning, waking him from sleeping. Pt reports a known hx of a kidney stone, pt states he is uncertain which kidney is affected. He denies dysuria. He reports an episode of diarrhea yesterday, which he states he took Imodium for. Otherwise he denies any issues over the past few days. He reports a known hx of Crohn's disease which he manages with Humira and reports a past hx of small bowel obstruction in 2021. He reports a known hx of HTN, managed with medications. He denies any CP, SOB, fever, chills, or vomiting.  ?

## 2021-04-06 NOTE — ED Notes (Signed)
PA-C at the bedside.  ?

## 2021-04-06 NOTE — Discharge Instructions (Signed)
You were diagnosed today with a kidney stone on the right side. You will more than likely be able to pass this stone at home. I will provide Zofran for nausea and a small prescription of percocet for pain. Continue to hydrate. I have provided contact information for the urologist on call in case you continue to have difficulty. Please follow up with your PCP in one week for repeat labs to check your kidney function. Return to the emergency department if you are unable to tolerate oral liquids, have chest pain, shortness of breath, or other life threatening conditions.  ?

## 2021-04-06 NOTE — ED Provider Notes (Cosign Needed)
[facility] DEPT Provider Note   CSN: [id number] Arrival date & time: 04/06/21  0919     History  Chief Complaint  Patient presents with   Flank Pain    Eric Moody is a 71 y.o. male.  Patient presents to the emergency department complaining of right-sided flank pain that began at 3 AM this morning.  The pain awakened him from his sleep.  The patient states he has never had pain like this before.  He rates his pain as 10 out of 10.  He does describe some radiation to the right upper quadrant.  He endorses nausea.  He denies diarrhea, denies fever, denies hematuria, denies dysuria, denies chest pain, denies shortness of breath.  PMH significant for diabetes, Crohn's, GERD, hypertension, previous bowel resection, previous small bowel obstruction.  HPI     Home Medications Prior to Admission medications   Medication Sig Start Date End Date Taking? Authorizing Provider  Adalimumab (HUMIRA) 40 MG/0.4ML PSKT Inject 40 mg into the skin once a week. Friday's   Yes [provider]  amLODipine (NORVASC) 10 MG tablet Take 10 mg by mouth daily. 04/03/21  Yes [provider]  azaTHIOprine (IMURAN) 50 MG tablet Take 150 mg by mouth daily. 03/06/19  Yes [provider]  cetirizine (ZYRTEC) 10 MG tablet Take 10 mg by mouth daily as needed for allergies.   Yes [provider]  Cholecalciferol (VITAMIN D3) 50 MCG (2000 UT) TABS Take 2,000 Units by mouth daily.   Yes [provider]  Coenzyme Q10 200 MG capsule Take 200 mg by mouth daily.   Yes [provider]  Cyanocobalamin (VITAMIN B-12) 5000 MCG TBDP Take 5,000 mcg by mouth daily.   Yes [provider]  FeFum-FePoly-FA-B Cmp-C-Biot (INTEGRA PLUS PO) Take 1 capsule by mouth daily.   Yes [provider]  guaiFENesin (MUCINEX) 600 MG 12 hr tablet Take 600 mg by mouth 2 (two) times daily as needed for cough.   Yes [provider]  ketotifen  (ZADITOR) 0.025 % ophthalmic solution Place 1 drop into both eyes daily as needed (allergies/itching).   Yes [provider]  Multiple Vitamins-Minerals (MULTIVITAMIN GUMMIES ADULT PO) Take 1 tablet by mouth daily.   Yes [provider]  ondansetron (ZOFRAN) 4 MG tablet Take 1 tablet (4 mg total) by mouth every 6 (six) hours. 04/06/21  Yes Dorothyann Peng, PA-C  oxyCODONE-acetaminophen (PERCOCET/ROXICET) 5-325 MG tablet Take 1 tablet by mouth every 6 (six) hours as needed for severe pain. 04/06/21  Yes Dorothyann Peng, PA-C  pantoprazole (PROTONIX) 40 MG tablet Take 40 mg by mouth daily. 03/06/19  Yes [provider]  Probiotic Product (PROBIOTIC-10 PO) Take 1 tablet by mouth daily.   Yes [provider]  rosuvastatin (CRESTOR) 5 MG tablet Take 5 mg by mouth every Monday, Wednesday, and Friday at 6 PM. 03/12/21  Yes [provider]  telmisartan (MICARDIS) 80 MG tablet Take 80 mg by mouth daily. 03/30/21  Yes [provider]      Allergies    Bean pod extract, Cat hair extract, Clindamycin hcl, Metformin, Penicillins, Sulfa antibiotics, and Poison ivy extract    Review of Systems   Review of Systems  Constitutional:  Negative for chills and fever.  Respiratory:  Negative for shortness of breath.   Cardiovascular:  Negative for chest pain.  Gastrointestinal:  Positive for abdominal pain and nausea. Negative for constipation and vomiting.  Pain radiating to RUQ  Genitourinary:  Positive for flank pain. Negative for dysuria and hematuria.       Right sided flank pain  Skin:  Negative for color change.   Physical Exam Updated Vital Signs BP (!) 149/84    Pulse 64    Temp (!) 97.5 F (36.4 C) (Oral)    Resp 16    Ht 5' 9"  (1.753 m)    Wt 74.8 kg    SpO2 98%    BMI 24.37 kg/m  Physical Exam Vitals and nursing note reviewed.  Constitutional:      Appearance: He is normal weight. He is diaphoretic.  HENT:     Head: Normocephalic and  atraumatic.  Eyes:     Conjunctiva/sclera: Conjunctivae normal.  Cardiovascular:     Rate and Rhythm: Normal rate and regular rhythm.     Pulses: Normal pulses.     Heart sounds: Normal heart sounds.  Pulmonary:     Effort: Pulmonary effort is normal.     Breath sounds: Normal breath sounds.  Abdominal:     Palpations: Abdomen is soft.     Tenderness: There is no abdominal tenderness. There is right CVA tenderness. There is no left CVA tenderness or guarding.     Comments: Scar noted from previous intraabdominal surgery  Musculoskeletal:     Cervical back: Normal range of motion.  Skin:    General: Skin is warm.  Neurological:     Mental Status: He is alert.    ED Results / Procedures / Treatments   Labs (all labs ordered are listed, but only abnormal results are displayed) Labs Reviewed  COMPREHENSIVE METABOLIC PANEL - Abnormal; Notable for the following components:      Result Value   Glucose, Bld 282 (*)    Creatinine, Ser 1.33 (*)    Total Protein 8.2 (*)    GFR, Estimated 58 (*)    All other components within normal limits  CBC WITH DIFFERENTIAL/PLATELET - Abnormal; Notable for the following components:   RBC 4.10 (*)    Hemoglobin 12.8 (*)    HCT 37.0 (*)    Neutro Abs 8.1 (*)    Lymphs Abs 0.6 (*)    All other components within normal limits  URINALYSIS, ROUTINE W REFLEX MICROSCOPIC - Abnormal; Notable for the following components:   Color, Urine STRAW (*)    Glucose, UA >=500 (*)    Ketones, ur 5 (*)    All other components within normal limits  LIPASE, BLOOD    EKG None  Radiology CT Renal Stone Study  Result Date: 04/06/2021 CLINICAL DATA:  Right flank pain and right upper quadrant pain since 0300 this morning EXAM: CT ABDOMEN AND PELVIS WITHOUT CONTRAST TECHNIQUE: Multidetector CT imaging of the abdomen and pelvis was performed following the standard protocol without IV contrast. RADIATION DOSE REDUCTION: This exam was performed according to the  departmental dose-optimization program which includes automated exposure control, adjustment of the mA and/or kV according to patient size and/or use of iterative reconstruction technique. COMPARISON:  March 23, 2019. FINDINGS: Lower chest: No acute abnormality. Hepatobiliary: Unremarkable noncontrast appearance of the hepatic parenchyma. Gallbladder is unremarkable. No biliary ductal dilation. Pancreas: No pancreatic ductal dilation or evidence of acute inflammation. Spleen: No splenomegaly or focal splenic lesion. Adrenals/Urinary Tract: Bilateral adrenal glands appear normal. Mild right hydroureteronephrosis with perinephric/periureteric stranding and a 2 mm stone at the ureterovesicular junction. Additional punctate nonobstructive right renal stones. Exophytic 11 mm left renal cyst.  No left-sided hydronephrosis. Urinary bladder is unremarkable for degree of distension. Stomach/Bowel: No enteric contrast was administered. Mild symmetric distal esophageal wall thickening. Stomach is distended with ingested material and gas without abnormal wall thickening. Periampullary duodenal diverticulum. No pathologic dilation of small or large bowel. Right lower quadrant colonic anastomotic sutures. No evidence of acute bowel inflammation. Vascular/Lymphatic: Scattered aortic and branch vessel atherosclerosis without abdominal aortic aneurysm. No pathologically enlarged abdominopelvic lymph nodes. Reproductive: Prostate is unremarkable. Other: No significant abdominopelvic free fluid. Musculoskeletal: Multilevel degenerative changes spine. No acute osseous abnormality. IMPRESSION: 1. Mild right hydroureteronephrosis with perinephric/periureteric stranding and a 2 mm stone at the ureterovesicular junction. 2. Additional punctate nonobstructive right renal stones. 3. Mild symmetric distal esophageal wall thickening, which can be seen in the setting of esophagitis. 4. Aortic Atherosclerosis (ICD10-I70.0). Electronically  Signed   By: Dahlia Bailiff M.D.   On: 04/06/2021 12:31    Procedures Procedures    Medications Ordered in ED Medications  sodium chloride 0.9 % bolus 1,000 mL (1,000 mLs Intravenous New Bag/Given 04/06/21 1039)  ondansetron (ZOFRAN) injection 4 mg (4 mg Intravenous Given 04/06/21 1141)  HYDROmorphone (DILAUDID) injection 1 mg (1 mg Intravenous Given 04/06/21 1142)    ED Course/ Medical Decision Making/ A&P                           Medical Decision Making Amount and/or Complexity of Data Reviewed Labs: ordered. Radiology: ordered.  Risk Prescription drug management.   This patient presents to the ED for concern of flank pain with radiation to right upper quadrant, this involves an extensive number of treatment options, and is a complaint that carries with it a high risk of complications and morbidity.  The differential diagnosis includes but is not limited to nephrolithiasis, pyelonephritis, cholecystitis, pancreatitis, and others   Co morbidities that complicate the patient evaluation  Hx of small bowel resection, Crohn's disease   Additional history obtained:  External records from outside source obtained and reviewed including care everywhere notes, office visit for Crohn's follow up 03/31/21   Lab Tests:  I Ordered, and personally interpreted labs.  The pertinent results include:  Creatinine 1.33, Glucose 282   Imaging Studies ordered:  I ordered imaging studies including CT renal stone study  I independently visualized and interpreted imaging which showed mild right hydroureteronephrosis with stranding and 39m stone at ureterovesical junction I agree with the radiologist interpretation   Medicines ordered and prescription drug management:  I ordered medication including zofran for nausea and dilaudid for pain  Reevaluation of the patient after these medicines showed that the patient improved I have reviewed the patients home medicines and have made adjustments  as needed    The patient has a small stone on CT with correlating increase in creatinine. The patient's pain and nausea are controllable at this time with medications. I see no indication for admission at this time and believe the patient may safely discharge home. I will prescribe Zofran and a short course of percocet. I will provided urology on call information in case needed. The patient should also follow up with primary care in one week to recheck creatinine levels for kidney function. Return precautions provided.   Final Clinical Impression(s) / ED Diagnoses Final diagnoses:  Kidney stone    Rx / DC Orders ED Discharge Orders          Ordered    oxyCODONE-acetaminophen (PERCOCET/ROXICET) 5-325 MG tablet  Every 6 hours PRN  04/06/21 1324    ondansetron (ZOFRAN) 4 MG tablet  Every 6 hours        04/06/21 1324              Ronny Bacon 04/06/21 1326

## 2021-04-09 DIAGNOSIS — E1169 Type 2 diabetes mellitus with other specified complication: Secondary | ICD-10-CM | POA: Diagnosis not present

## 2021-04-09 DIAGNOSIS — I10 Essential (primary) hypertension: Secondary | ICD-10-CM | POA: Diagnosis not present

## 2021-04-09 DIAGNOSIS — E7800 Pure hypercholesterolemia, unspecified: Secondary | ICD-10-CM | POA: Diagnosis not present

## 2021-05-27 DIAGNOSIS — K219 Gastro-esophageal reflux disease without esophagitis: Secondary | ICD-10-CM | POA: Diagnosis not present

## 2021-05-27 DIAGNOSIS — E119 Type 2 diabetes mellitus without complications: Secondary | ICD-10-CM | POA: Diagnosis not present

## 2021-05-27 DIAGNOSIS — Z7984 Long term (current) use of oral hypoglycemic drugs: Secondary | ICD-10-CM | POA: Diagnosis not present

## 2021-05-27 DIAGNOSIS — Z980 Intestinal bypass and anastomosis status: Secondary | ICD-10-CM | POA: Diagnosis not present

## 2021-05-27 DIAGNOSIS — K529 Noninfective gastroenteritis and colitis, unspecified: Secondary | ICD-10-CM | POA: Diagnosis not present

## 2021-05-27 DIAGNOSIS — D649 Anemia, unspecified: Secondary | ICD-10-CM | POA: Diagnosis not present

## 2021-05-27 DIAGNOSIS — I10 Essential (primary) hypertension: Secondary | ICD-10-CM | POA: Diagnosis not present

## 2021-05-27 DIAGNOSIS — Z880 Allergy status to penicillin: Secondary | ICD-10-CM | POA: Diagnosis not present

## 2021-05-27 DIAGNOSIS — K644 Residual hemorrhoidal skin tags: Secondary | ICD-10-CM | POA: Diagnosis not present

## 2021-05-27 DIAGNOSIS — K5080 Crohn's disease of both small and large intestine without complications: Secondary | ICD-10-CM | POA: Diagnosis not present

## 2021-05-27 DIAGNOSIS — Z8619 Personal history of other infectious and parasitic diseases: Secondary | ICD-10-CM | POA: Diagnosis not present

## 2021-05-27 DIAGNOSIS — Z882 Allergy status to sulfonamides status: Secondary | ICD-10-CM | POA: Diagnosis not present

## 2021-05-27 DIAGNOSIS — K5000 Crohn's disease of small intestine without complications: Secondary | ICD-10-CM | POA: Diagnosis not present

## 2021-06-17 DIAGNOSIS — K219 Gastro-esophageal reflux disease without esophagitis: Secondary | ICD-10-CM | POA: Diagnosis not present

## 2021-06-17 DIAGNOSIS — I10 Essential (primary) hypertension: Secondary | ICD-10-CM | POA: Diagnosis not present

## 2021-06-17 DIAGNOSIS — E559 Vitamin D deficiency, unspecified: Secondary | ICD-10-CM | POA: Diagnosis not present

## 2021-06-17 DIAGNOSIS — E538 Deficiency of other specified B group vitamins: Secondary | ICD-10-CM | POA: Diagnosis not present

## 2021-06-17 DIAGNOSIS — E1169 Type 2 diabetes mellitus with other specified complication: Secondary | ICD-10-CM | POA: Diagnosis not present

## 2021-06-17 DIAGNOSIS — K50019 Crohn's disease of small intestine with unspecified complications: Secondary | ICD-10-CM | POA: Diagnosis not present

## 2021-06-17 DIAGNOSIS — D509 Iron deficiency anemia, unspecified: Secondary | ICD-10-CM | POA: Diagnosis not present

## 2021-06-17 DIAGNOSIS — L608 Other nail disorders: Secondary | ICD-10-CM | POA: Diagnosis not present

## 2021-07-22 DIAGNOSIS — D485 Neoplasm of uncertain behavior of skin: Secondary | ICD-10-CM | POA: Diagnosis not present

## 2021-07-22 DIAGNOSIS — L570 Actinic keratosis: Secondary | ICD-10-CM | POA: Diagnosis not present

## 2021-07-22 DIAGNOSIS — Z85828 Personal history of other malignant neoplasm of skin: Secondary | ICD-10-CM | POA: Diagnosis not present

## 2021-07-22 DIAGNOSIS — D225 Melanocytic nevi of trunk: Secondary | ICD-10-CM | POA: Diagnosis not present

## 2021-09-29 DIAGNOSIS — K50819 Crohn's disease of both small and large intestine with unspecified complications: Secondary | ICD-10-CM | POA: Diagnosis not present

## 2021-10-19 DIAGNOSIS — Z23 Encounter for immunization: Secondary | ICD-10-CM | POA: Diagnosis not present

## 2021-10-29 DIAGNOSIS — Z23 Encounter for immunization: Secondary | ICD-10-CM | POA: Diagnosis not present

## 2021-11-01 IMAGING — RF DG UGI W/ SMALL BOWEL
1 series · 14 of 23 positions shown · non-contrast
Comparison: CT on 02/09/2018

CLINICAL DATA: Exacerbation of Crohn disease. Abdominal pain and
nausea.

EXAM:
UPPER GI SERIES WITH SMALL BOWEL FOLLOW-THROUGH
FLUOROSCOPY TIME:  Fluoroscopy Time:  2 minutes 18 seconds
Radiation Exposure Index (if provided by the fluoroscopic device):
486.5 mGy
Number of Acquired Spot Images: 0
TECHNIQUE: Combined double contrast and single contrast upper GI series using
effervescent crystals, thick barium, and thin barium. Subsequently,
serial images of the small bowel were obtained including spot views
of the terminal ileum.

[Series 1: one shot · 0.15mm/px · 14 of 23 slices shown]
[im 1/23]
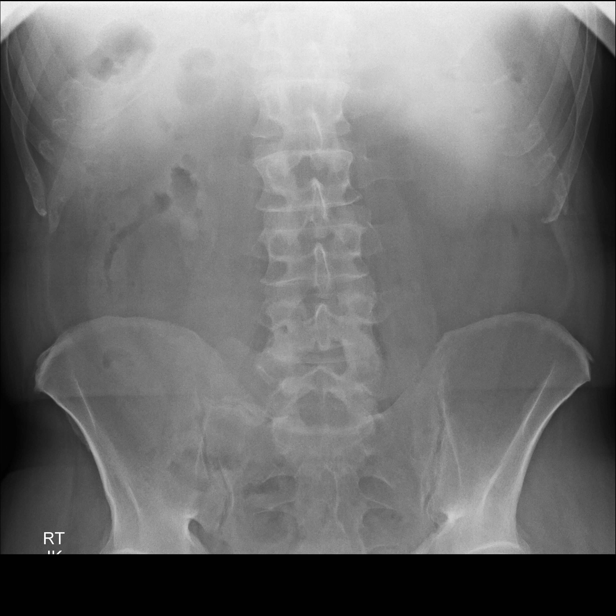
[im 3/23]
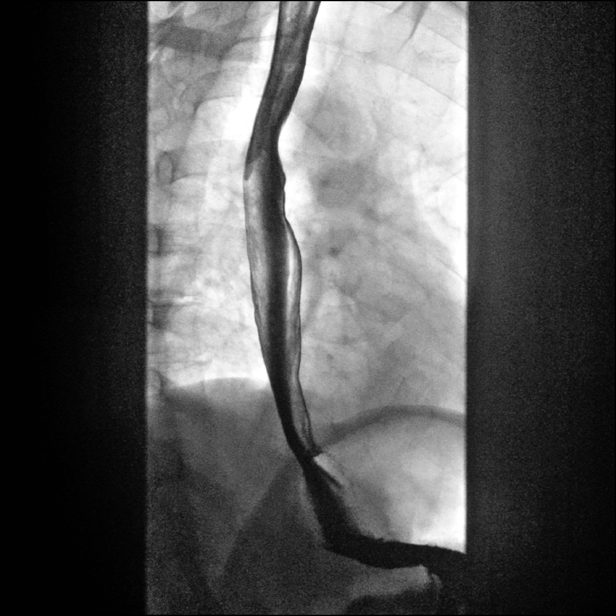
[im 5/23]
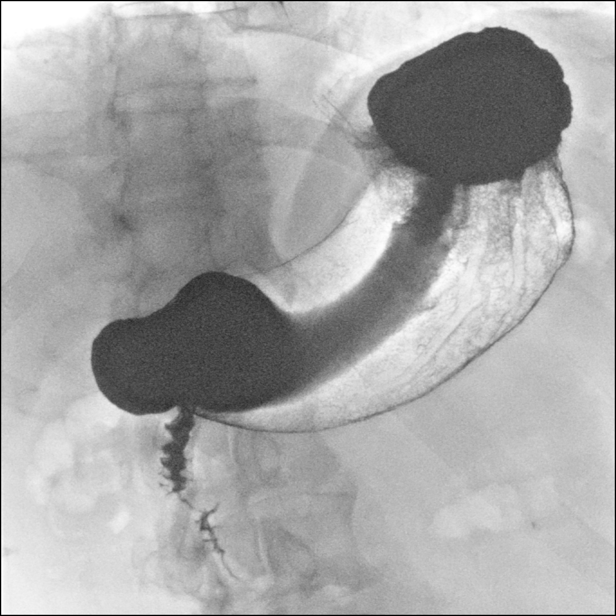
[im 6/23]
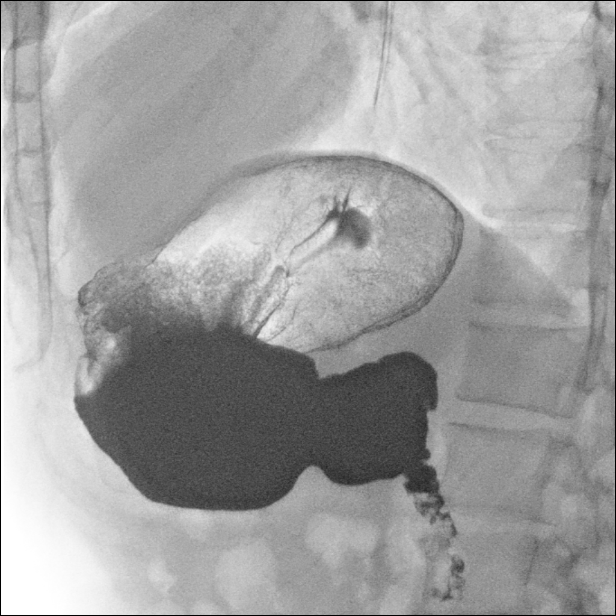
[im 8/23]
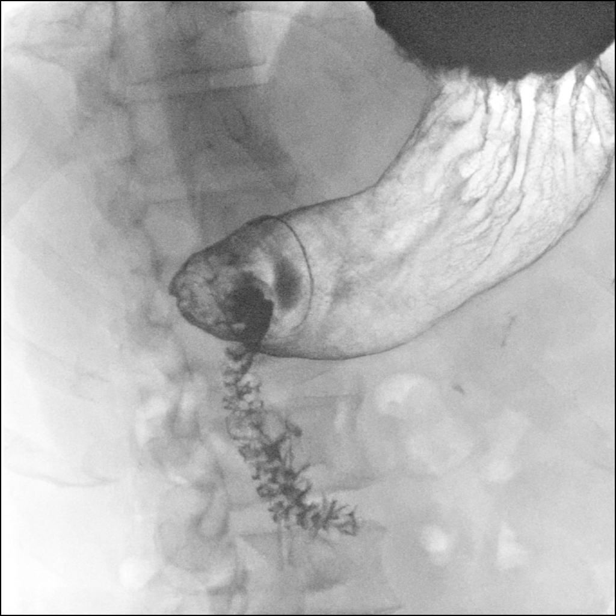
[im 10/23]
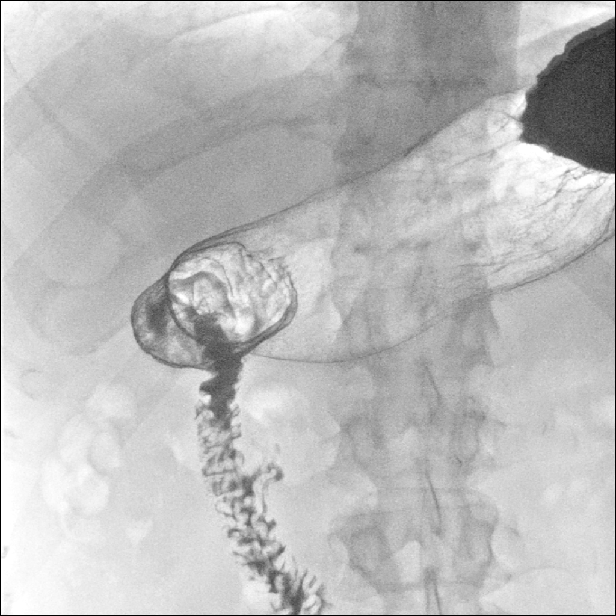
[im 11/23]
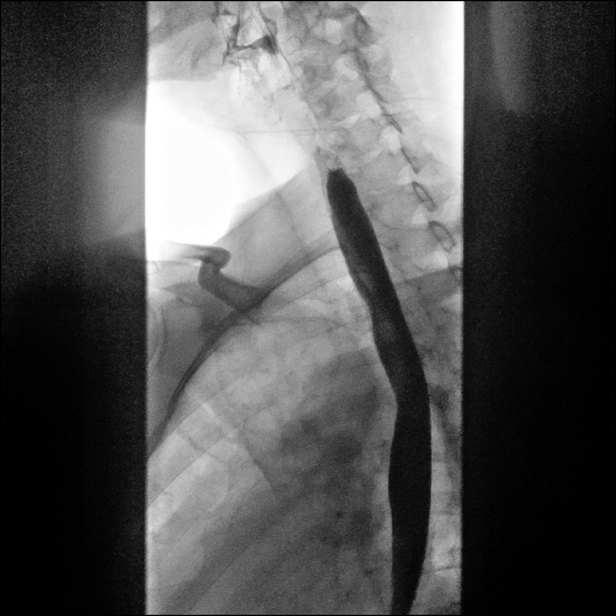
[im 13/23]
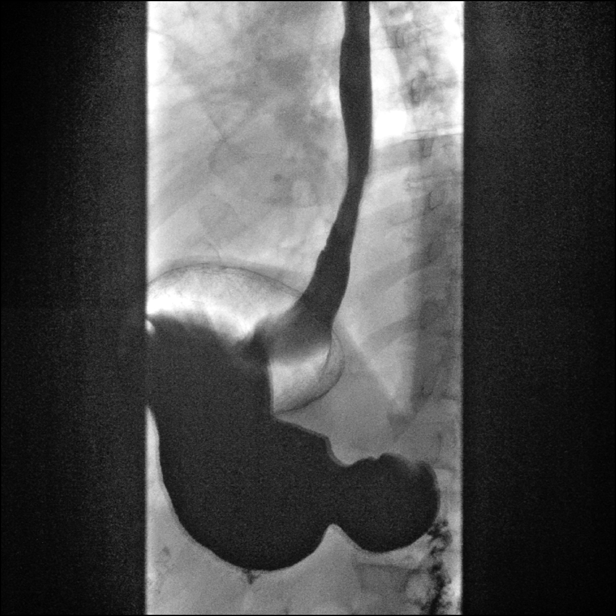
[im 14/23]
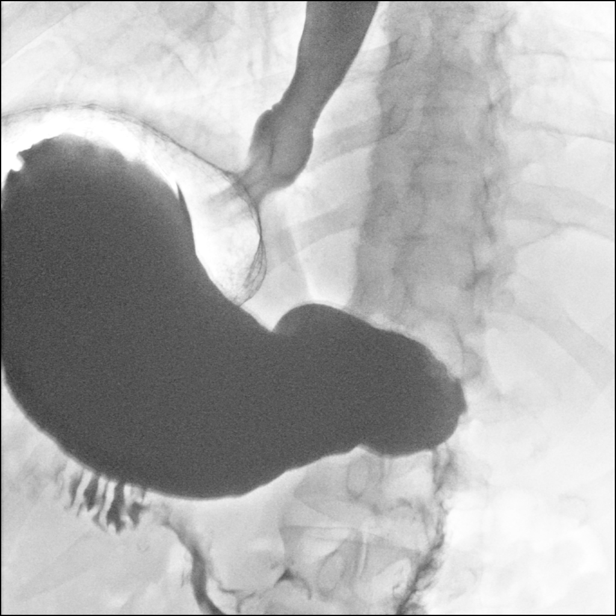
[im 16/23]
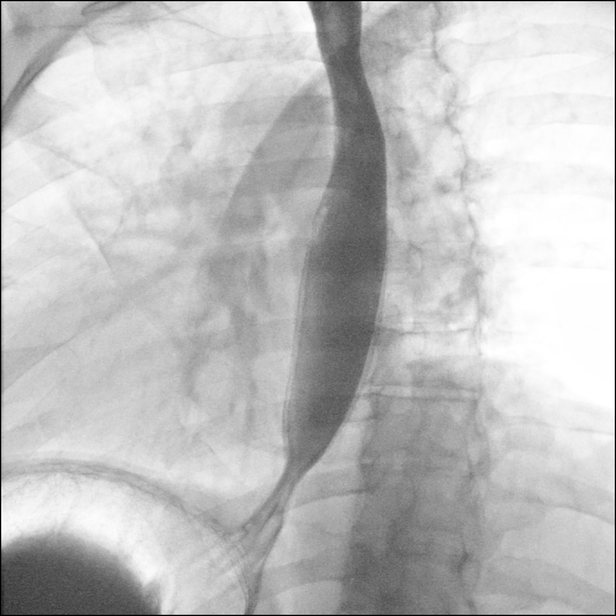
[im 18/23]
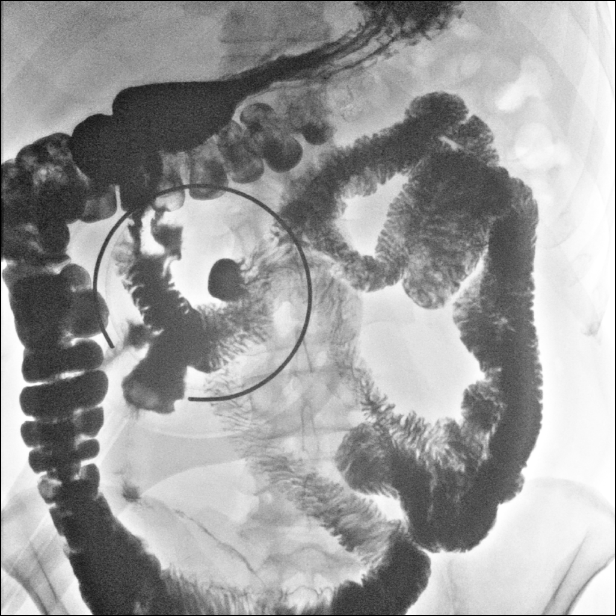
[im 19/23]
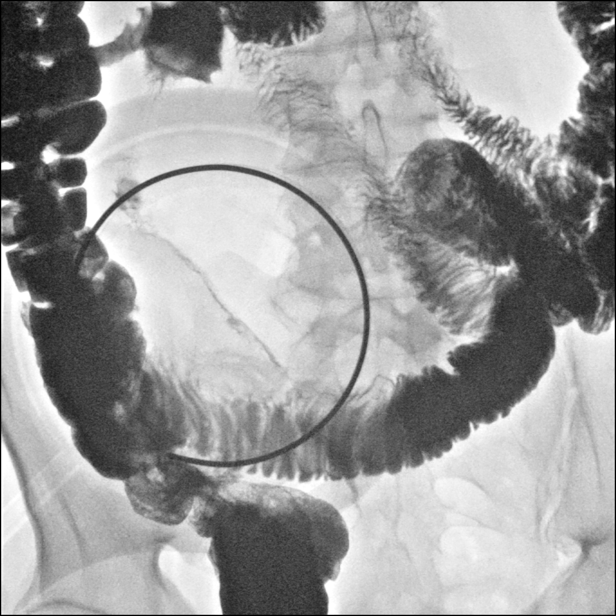
[im 21/23]
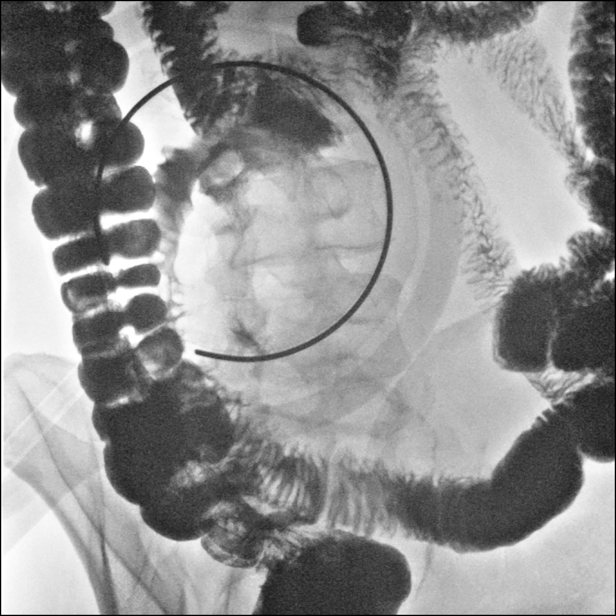
[im 23/23]
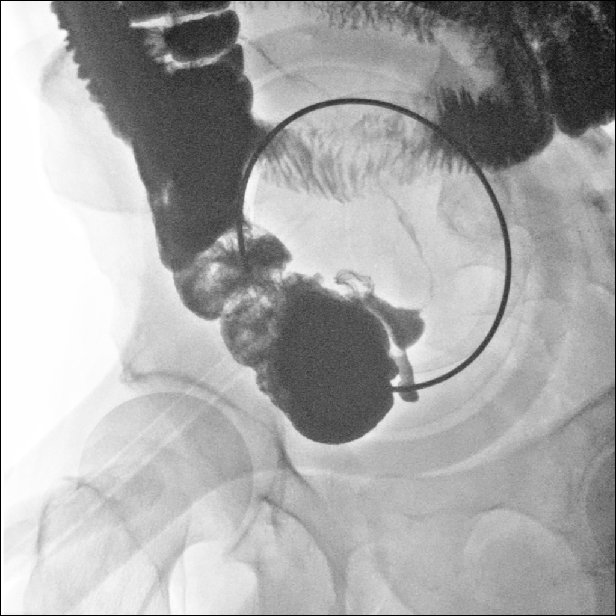

[14 of 23 positions shown; findings below may reference images not displayed]

FINDINGS: Scout radiograph:  Unremarkable bowel gas pattern.

Esophagus: No evidence of esophageal mass or stricture. Motility is
within normal limits. No gastroesophageal reflux observed.

Stomach: No hiatal hernia visualized. No evidence of gastric mass or
ulcer.

Duodenum: No ulcer, abnormal fold thickening, or stricture. Several
small duodenal diverticula noted.

Jejunum and Ileum: Rapid small bowel transit time of less than 30
minutes noted. No evidence of small bowel dilatation or obstruction.
Severe luminal narrowing is seen involving a loop of distal ileum
and the terminal ileum in the right lower quadrant. The severely
narrowed lumen has smoothly tapered appearance. This is similar in
appearance to previous CT, and may be due to chronic stricture
rather than active Crohn disease. No evidence of fistula.

Other:  None.
IMPRESSION: Severe smooth luminal narrowing involving a loop of distal ileum and
the terminal ileum in the right lower quadrant, similar in
appearance to prior CT. This may be due to chronic stricture rather
than active Crohn disease. Consider further evaluation with CT
enterography.

No evidence of small bowel fistula, obstruction, or other
complication.

Several small duodenal diverticula incidentally noted. Otherwise
negative upper GI series.

## 2021-11-12 DIAGNOSIS — R918 Other nonspecific abnormal finding of lung field: Secondary | ICD-10-CM | POA: Diagnosis not present

## 2021-11-12 DIAGNOSIS — K5090 Crohn's disease, unspecified, without complications: Secondary | ICD-10-CM | POA: Diagnosis not present

## 2021-11-12 DIAGNOSIS — K50819 Crohn's disease of both small and large intestine with unspecified complications: Secondary | ICD-10-CM | POA: Diagnosis not present

## 2021-11-12 DIAGNOSIS — Z980 Intestinal bypass and anastomosis status: Secondary | ICD-10-CM | POA: Diagnosis not present

## 2021-11-12 DIAGNOSIS — Z9049 Acquired absence of other specified parts of digestive tract: Secondary | ICD-10-CM | POA: Diagnosis not present

## 2021-12-06 IMAGING — DX DG ABD PORTABLE 1V
1 series · 1 of 1 positions shown · non-contrast
Comparison: 03/23/2019

CLINICAL DATA: Enteric catheter placement, Crohn disease

EXAM:
PORTABLE ABDOMEN - 1 VIEW

[abdomen kub]
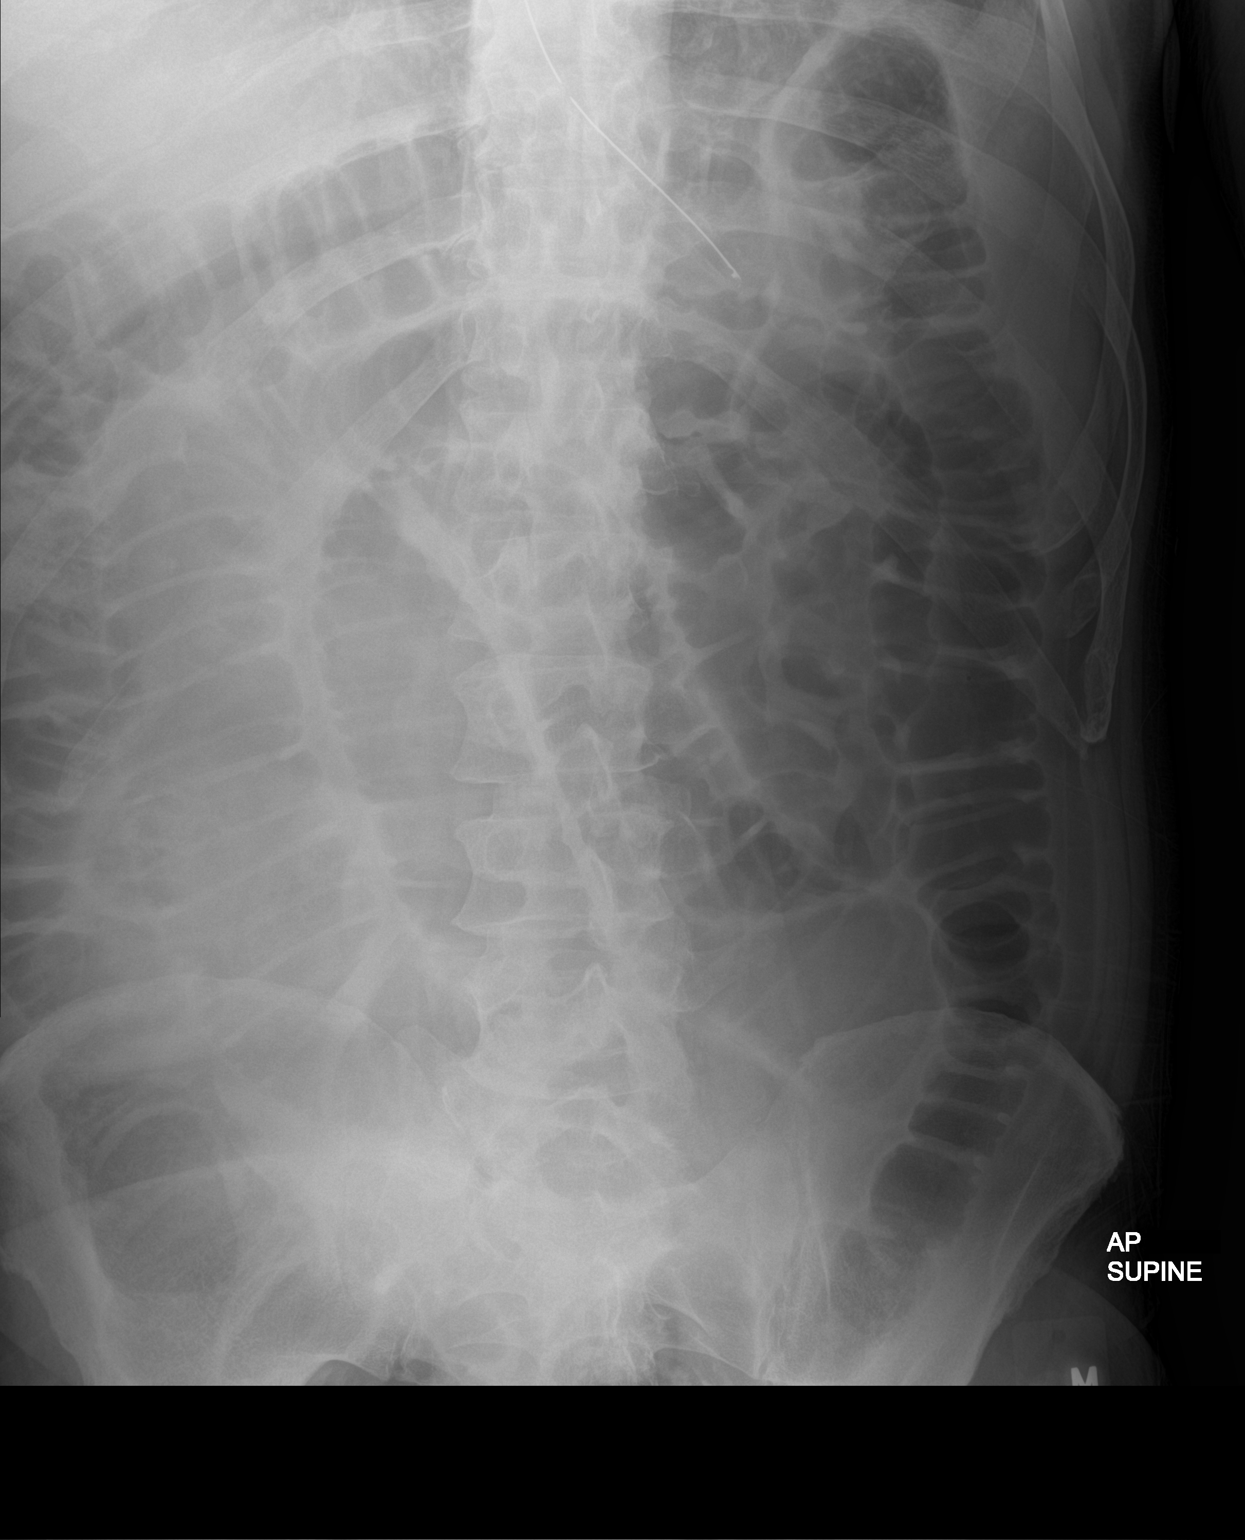

[1 of 1 positions shown; findings below may reference images not displayed]

FINDINGS: Frontal view of the abdomen and pelvis excludes the pubic symphysis,
hemidiaphragms, and right flank by collimation. Enteric catheter tip
projects over gastric body. There is increasing gaseous distension
of the small bowel consistent with obstruction. There is excreted
contrast within the urinary bladder. No abdominal masses or abnormal
calcifications. Barium filled diverticulum within the pelvis.
IMPRESSION: 1. Progressive gaseous distention of the small bowel consistent with
high-grade obstruction.
2. Enteric catheter overlying gastric body.

## 2021-12-06 IMAGING — CR DG ABDOMEN 2V
3 series · 3 of 3 positions shown · non-contrast
Comparison: Upper GI small-bowel follow-through 02/16/2019. CT
abdomen and pelvis 02/09/2018.

CLINICAL DATA: Abdominal pain. Abnormal upper GI and small-bowel
follow-through.

EXAM:
ABDOMEN - 2 VIEW

[w abdomen upright *]
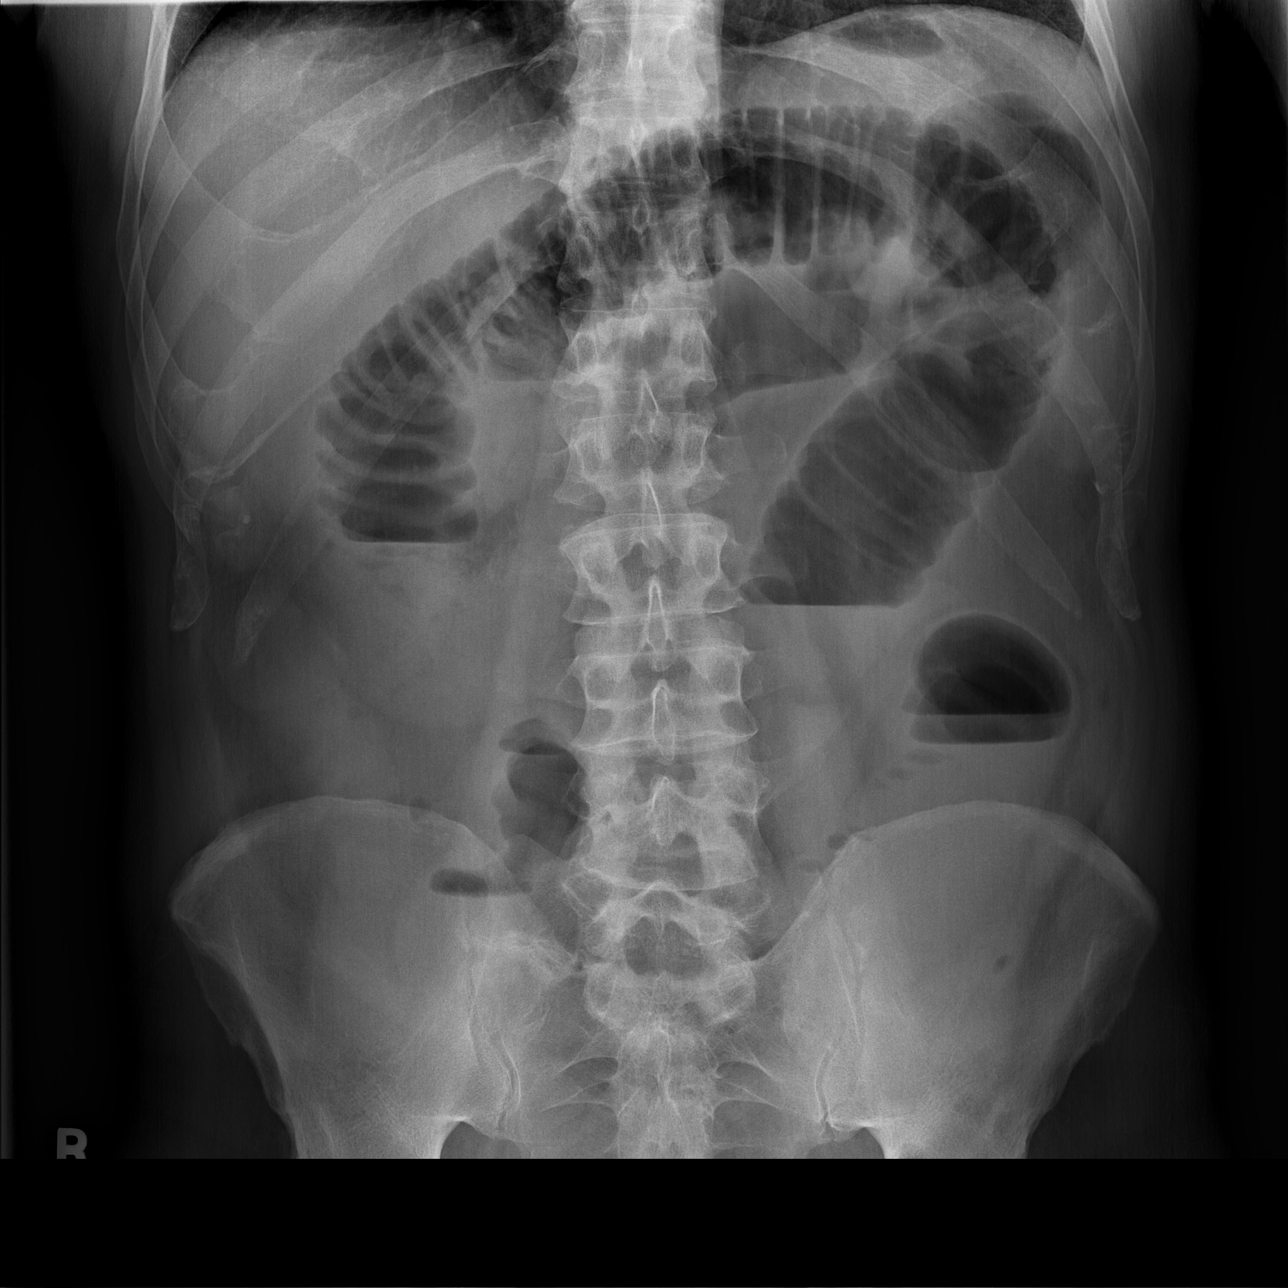

[t abdomen supine (1 of 2)]
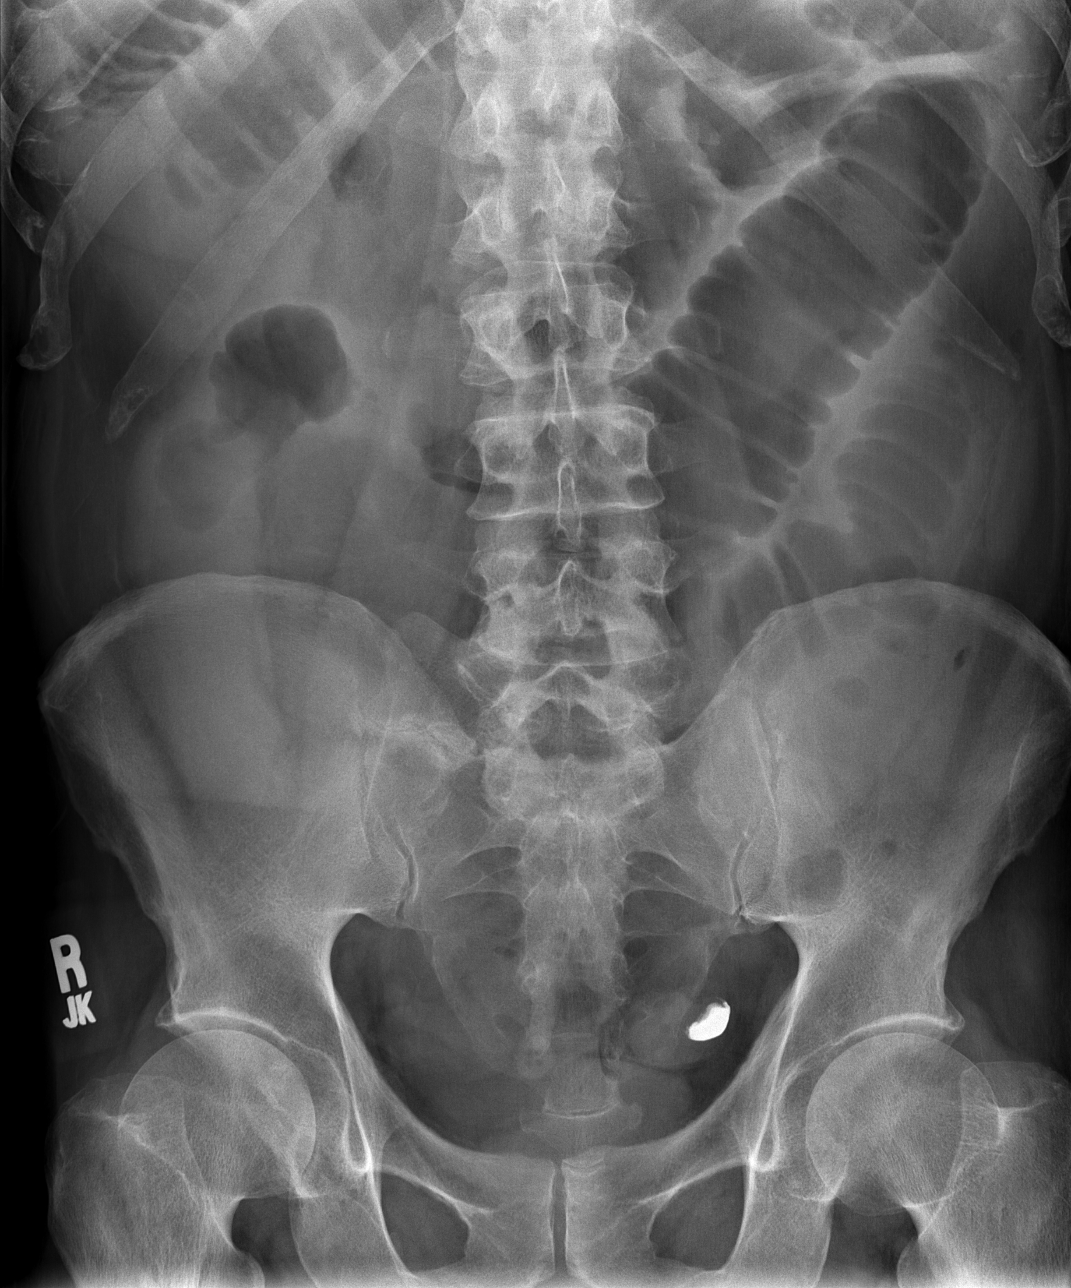

[t abdomen supine (2 of 2)]
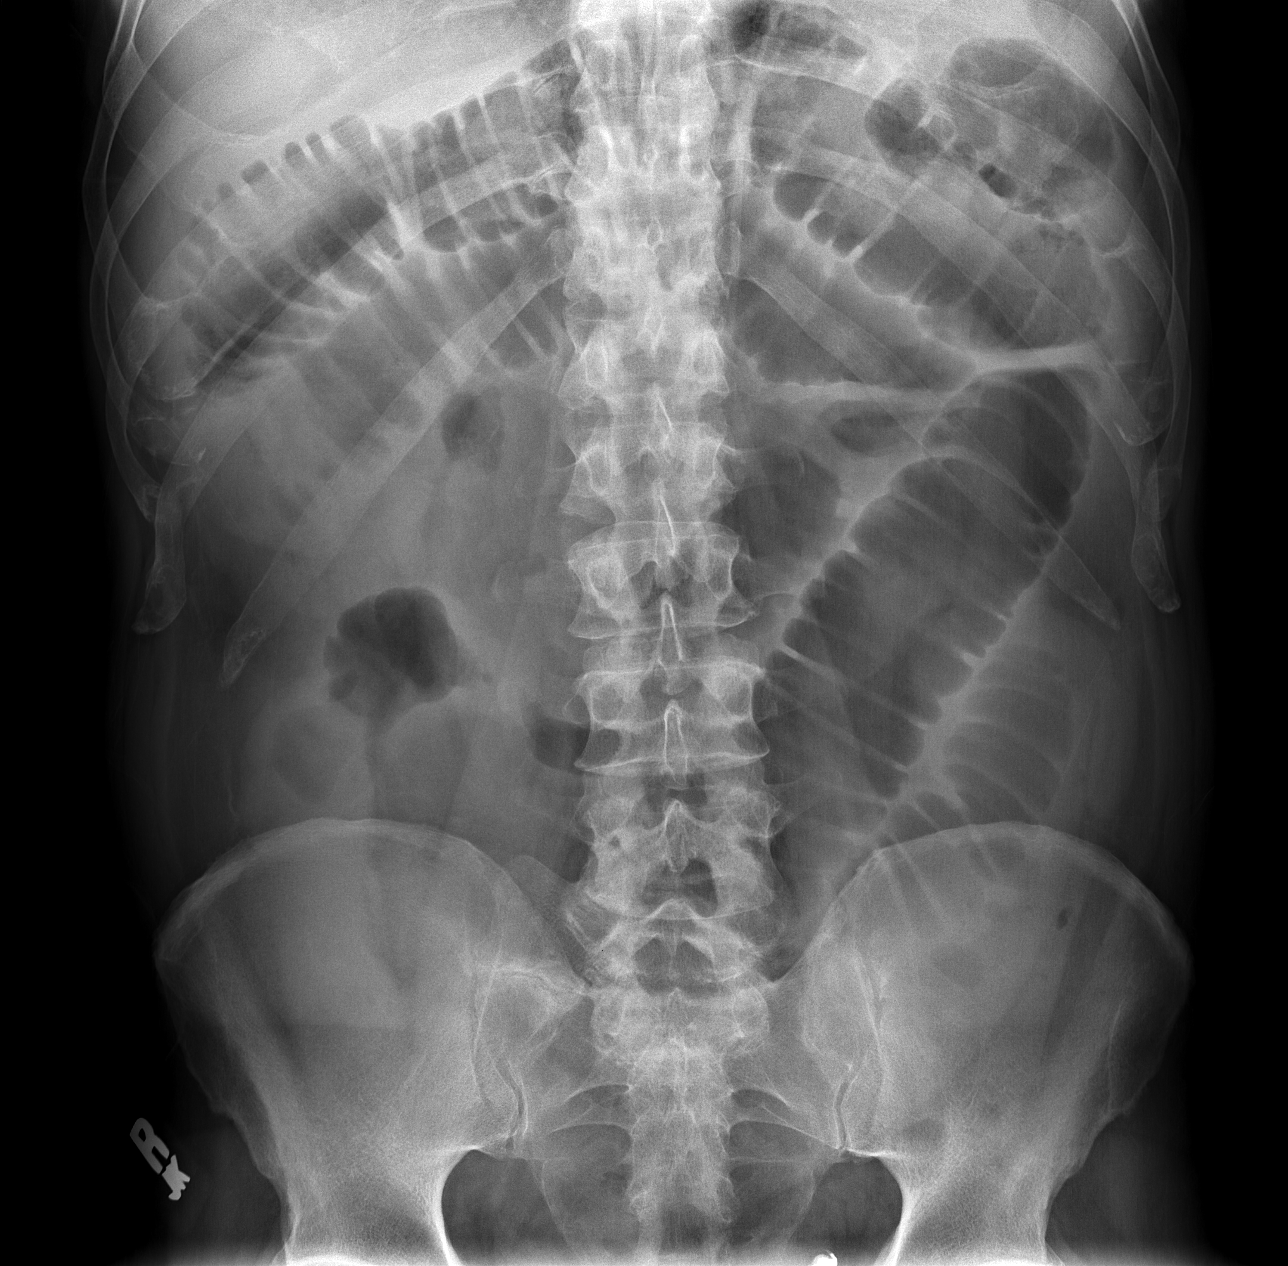

[3 of 3 positions shown; findings below may reference images not displayed]

FINDINGS: Multiple prominently distended loops of small bowel noted. Relative
paucity of colonic air. Findings most consistent with small bowel
obstruction. No free air is identified. Residual barium noted over
the left lower pelvis. No acute bony abnormality identified.
IMPRESSION: Multiple prominently distended loops of small bowel noted most
consistent with small bowel obstruction. Small-bowel obstruction may
be secondary to previously identified ileal disease. Reference is
made to prior upper GI small-bowel report of 02/16/2019 and CT
abdomen and pelvis report of 02/09/2018.

## 2021-12-06 IMAGING — CT CT ENTEROGRAPHY (ABD-PELV W/ CM)
1 series · 1 of 1 positions shown · IV contrast (omnipaque)
Comparison: Abdominopelvic CT 02/09/2018 and 04/17/2014. Upper GI
series 02/16/2019.

CLINICAL DATA: Left lower abdominal pain for 12 days. History of
Crohn's ileitis/inflammatory bowel disease. Small-bowel obstruction.

EXAM:
CT ABDOMEN AND PELVIS WITH CONTRAST (ENTEROGRAPHY)
TECHNIQUE: Multidetector CT of the abdomen and pelvis during bolus
administration of intravenous contrast. Negative oral contrast was
given.
CONTRAST:  100mL OMNIPAQUE IOHEXOL 300 MG/ML  SOLN

[Series 1: topogram 0.6 t20f · coronal · 1.00mm/px · 1 of 1 slices shown]
[im 1/1]
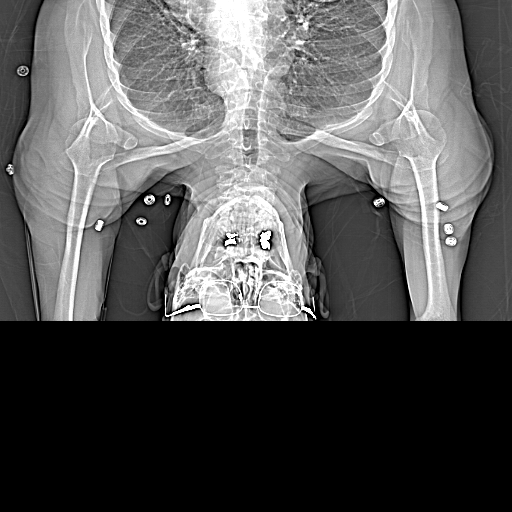

[1 of 1 positions shown; findings below may reference images not displayed]

FINDINGS: Lower chest: There are small subpleural nodules at both lung bases
including a 5 mm nodule in the right lower lobe on image [DATE] which
is unchanged. There are other nodules which were not previously
imaged, including a 4 mm right lower lobe nodule on image [DATE], and a
6 mm left lower lobe nodule on image [DATE]. No significant pleural or
pericardial effusion.

Hepatobiliary: The liver is normal in density without suspicious
focal abnormality. No evidence of gallstones, gallbladder wall
thickening or biliary dilatation.

Pancreas: Unremarkable. No pancreatic ductal dilatation or
surrounding inflammatory changes.

Spleen: Normal in size without focal abnormality.

Adrenals/Urinary Tract: Both adrenal glands appear normal. Single
nonobstructing calculus in the interpolar region of the right
kidney, best seen on coronal image 115/7. No evidence of ureteral
calculus or hydronephrosis. There are small left renal cysts,
largest in the lower pole. The bladder appears normal.

Stomach/Bowel: The stomach is fluid-filled and mildly distended.
Several duodenal diverticula are again noted. There is moderate
diffuse small bowel distension which is new compared with the prior
CT. A long segment of wall thickening involving the terminal ileum
is again noted, similar in distribution to the previous studies. The
degree of wall thickening in these areas has improved compared with
the most recent CT of 13 months ago. The colon appears decompressed
without wall thickening. The appendix appears normal. No evidence of
bowel perforation.

Vascular/Lymphatic: There are no enlarged abdominal or pelvic lymph
nodes. Minimal aortic and branch vessel atherosclerosis. The portal,
superior mesenteric and splenic veins are patent.

Reproductive: The prostate gland and seminal vesicles appear normal.

Other: No evidence of abdominal wall mass or hernia. No ascites.

Musculoskeletal: No acute or significant osseous findings.
IMPRESSION: 1. New moderate diffuse small bowel distension consistent with
distal partial small bowel obstruction. Previously demonstrated long
segment wall thickening of the terminal ileum has improved compared
with the most recent CT of 13 months ago, and the obstruction may be
related to a developing stricture.
2. No evidence of bowel perforation, abscess or fistula.
3. Nonobstructing right renal calculus.
4. Small subpleural nodules at both lung bases, some of which were
not previously imaged. These are of doubtful significance, and no
dedicated follow-up is required if this patient is low risk for
bronchogenic carcinoma (and has no known or suspected primary
neoplasm). Non-contrast chest CT can be considered in 12 months if
patient is high-risk. This recommendation follows the consensus
statement: Guidelines for Management of Incidental Pulmonary Nodules
Detected on CT Images: From the [HOSPITAL] 1955; Radiology

## 2021-12-24 DIAGNOSIS — Z79899 Other long term (current) drug therapy: Secondary | ICD-10-CM | POA: Diagnosis not present

## 2021-12-24 DIAGNOSIS — K50019 Crohn's disease of small intestine with unspecified complications: Secondary | ICD-10-CM | POA: Diagnosis not present

## 2021-12-24 DIAGNOSIS — K219 Gastro-esophageal reflux disease without esophagitis: Secondary | ICD-10-CM | POA: Diagnosis not present

## 2021-12-24 DIAGNOSIS — E1169 Type 2 diabetes mellitus with other specified complication: Secondary | ICD-10-CM | POA: Diagnosis not present

## 2021-12-24 DIAGNOSIS — Z125 Encounter for screening for malignant neoplasm of prostate: Secondary | ICD-10-CM | POA: Diagnosis not present

## 2021-12-24 DIAGNOSIS — I10 Essential (primary) hypertension: Secondary | ICD-10-CM | POA: Diagnosis not present

## 2021-12-24 DIAGNOSIS — Z1331 Encounter for screening for depression: Secondary | ICD-10-CM | POA: Diagnosis not present

## 2021-12-24 DIAGNOSIS — N400 Enlarged prostate without lower urinary tract symptoms: Secondary | ICD-10-CM | POA: Diagnosis not present

## 2021-12-24 DIAGNOSIS — Z0000 Encounter for general adult medical examination without abnormal findings: Secondary | ICD-10-CM | POA: Diagnosis not present

## 2022-01-07 DIAGNOSIS — I10 Essential (primary) hypertension: Secondary | ICD-10-CM | POA: Diagnosis not present

## 2022-01-07 DIAGNOSIS — H35033 Hypertensive retinopathy, bilateral: Secondary | ICD-10-CM | POA: Diagnosis not present

## 2022-01-07 DIAGNOSIS — H53143 Visual discomfort, bilateral: Secondary | ICD-10-CM | POA: Diagnosis not present

## 2022-01-07 DIAGNOSIS — H43813 Vitreous degeneration, bilateral: Secondary | ICD-10-CM | POA: Diagnosis not present

## 2022-01-07 DIAGNOSIS — E119 Type 2 diabetes mellitus without complications: Secondary | ICD-10-CM | POA: Diagnosis not present

## 2022-01-07 DIAGNOSIS — H25813 Combined forms of age-related cataract, bilateral: Secondary | ICD-10-CM | POA: Diagnosis not present

## 2022-02-16 DIAGNOSIS — H838X3 Other specified diseases of inner ear, bilateral: Secondary | ICD-10-CM | POA: Diagnosis not present

## 2022-02-16 DIAGNOSIS — H9311 Tinnitus, right ear: Secondary | ICD-10-CM | POA: Diagnosis not present

## 2022-02-16 DIAGNOSIS — H903 Sensorineural hearing loss, bilateral: Secondary | ICD-10-CM | POA: Diagnosis not present

## 2022-02-17 ENCOUNTER — Other Ambulatory Visit: Payer: Self-pay | Admitting: Otolaryngology

## 2022-02-17 ENCOUNTER — Encounter: Payer: Self-pay | Admitting: Otolaryngology

## 2022-02-17 DIAGNOSIS — H93A9 Pulsatile tinnitus, unspecified ear: Secondary | ICD-10-CM

## 2022-03-06 ENCOUNTER — Ambulatory Visit
Admission: RE | Admit: 2022-03-06 | Discharge: 2022-03-06 | Disposition: A | Discharge status: Home / Self Care | Payer: Medicare Other | Source: Ambulatory Visit | Attending: Otolaryngology | Admitting: Otolaryngology

## 2022-03-06 DIAGNOSIS — H93A9 Pulsatile tinnitus, unspecified ear: Secondary | ICD-10-CM

## 2022-03-06 MED ORDER — GADOPICLENOL 0.5 MMOL/ML IV SOLN
8.0000 mL | Freq: Once | INTRAVENOUS | Status: AC | PRN
  Administered 2022-03-06: 8 mL via INTRAVENOUS

## 2022-03-09 ENCOUNTER — Other Ambulatory Visit: Payer: Self-pay | Admitting: Otolaryngology

## 2022-03-09 DIAGNOSIS — H93A9 Pulsatile tinnitus, unspecified ear: Secondary | ICD-10-CM

## 2022-04-06 ENCOUNTER — Ambulatory Visit
Admission: RE | Admit: 2022-04-06 | Discharge: 2022-04-06 | Discharge status: Home / Self Care | Payer: Medicare Other | Source: Ambulatory Visit | Attending: Otolaryngology | Admitting: Otolaryngology

## 2022-04-06 DIAGNOSIS — H93A9 Pulsatile tinnitus, unspecified ear: Secondary | ICD-10-CM

## 2022-04-06 DIAGNOSIS — H93A1 Pulsatile tinnitus, right ear: Secondary | ICD-10-CM | POA: Diagnosis not present

## 2022-04-06 MED ORDER — IOPAMIDOL (ISOVUE-370) INJECTION 76%
75.0000 mL | Freq: Once | INTRAVENOUS | Status: AC | PRN
  Administered 2022-04-06: 75 mL via INTRAVENOUS

## 2022-04-20 DIAGNOSIS — K50019 Crohn's disease of small intestine with unspecified complications: Secondary | ICD-10-CM | POA: Diagnosis not present

## 2022-06-28 DIAGNOSIS — N400 Enlarged prostate without lower urinary tract symptoms: Secondary | ICD-10-CM | POA: Diagnosis not present

## 2022-06-28 DIAGNOSIS — K50019 Crohn's disease of small intestine with unspecified complications: Secondary | ICD-10-CM | POA: Diagnosis not present

## 2022-06-28 DIAGNOSIS — I10 Essential (primary) hypertension: Secondary | ICD-10-CM | POA: Diagnosis not present

## 2022-06-28 DIAGNOSIS — K219 Gastro-esophageal reflux disease without esophagitis: Secondary | ICD-10-CM | POA: Diagnosis not present

## 2022-06-28 DIAGNOSIS — E7800 Pure hypercholesterolemia, unspecified: Secondary | ICD-10-CM | POA: Diagnosis not present

## 2022-06-28 DIAGNOSIS — E1169 Type 2 diabetes mellitus with other specified complication: Secondary | ICD-10-CM | POA: Diagnosis not present

## 2022-06-28 DIAGNOSIS — R17 Unspecified jaundice: Secondary | ICD-10-CM | POA: Diagnosis not present

## 2022-06-28 DIAGNOSIS — I739 Peripheral vascular disease, unspecified: Secondary | ICD-10-CM | POA: Diagnosis not present

## 2022-08-03 DIAGNOSIS — L821 Other seborrheic keratosis: Secondary | ICD-10-CM | POA: Diagnosis not present

## 2022-08-03 DIAGNOSIS — L814 Other melanin hyperpigmentation: Secondary | ICD-10-CM | POA: Diagnosis not present

## 2022-08-03 DIAGNOSIS — L820 Inflamed seborrheic keratosis: Secondary | ICD-10-CM | POA: Diagnosis not present

## 2022-08-03 DIAGNOSIS — H00014 Hordeolum externum left upper eyelid: Secondary | ICD-10-CM | POA: Diagnosis not present

## 2022-08-03 DIAGNOSIS — D2271 Melanocytic nevi of right lower limb, including hip: Secondary | ICD-10-CM | POA: Diagnosis not present

## 2022-08-03 DIAGNOSIS — H52223 Regular astigmatism, bilateral: Secondary | ICD-10-CM | POA: Diagnosis not present

## 2022-08-03 DIAGNOSIS — H524 Presbyopia: Secondary | ICD-10-CM | POA: Diagnosis not present

## 2022-08-03 DIAGNOSIS — D1801 Hemangioma of skin and subcutaneous tissue: Secondary | ICD-10-CM | POA: Diagnosis not present

## 2022-08-03 DIAGNOSIS — H5213 Myopia, bilateral: Secondary | ICD-10-CM | POA: Diagnosis not present

## 2022-08-03 DIAGNOSIS — Z85828 Personal history of other malignant neoplasm of skin: Secondary | ICD-10-CM | POA: Diagnosis not present

## 2022-08-03 DIAGNOSIS — D225 Melanocytic nevi of trunk: Secondary | ICD-10-CM | POA: Diagnosis not present

## 2022-08-03 DIAGNOSIS — D2262 Melanocytic nevi of left upper limb, including shoulder: Secondary | ICD-10-CM | POA: Diagnosis not present

## 2022-09-23 DIAGNOSIS — Z23 Encounter for immunization: Secondary | ICD-10-CM | POA: Diagnosis not present

## 2022-10-12 DIAGNOSIS — K50019 Crohn's disease of small intestine with unspecified complications: Secondary | ICD-10-CM | POA: Diagnosis not present

## 2022-10-12 DIAGNOSIS — M832 Adult osteomalacia due to malabsorption: Secondary | ICD-10-CM | POA: Diagnosis not present

## 2022-11-17 DIAGNOSIS — Z23 Encounter for immunization: Secondary | ICD-10-CM | POA: Diagnosis not present

## 2022-11-29 DIAGNOSIS — K50019 Crohn's disease of small intestine with unspecified complications: Secondary | ICD-10-CM | POA: Diagnosis not present

## 2022-12-07 DIAGNOSIS — R1900 Intra-abdominal and pelvic swelling, mass and lump, unspecified site: Secondary | ICD-10-CM | POA: Diagnosis not present

## 2023-01-04 DIAGNOSIS — I739 Peripheral vascular disease, unspecified: Secondary | ICD-10-CM | POA: Diagnosis not present

## 2023-01-04 DIAGNOSIS — E1169 Type 2 diabetes mellitus with other specified complication: Secondary | ICD-10-CM | POA: Diagnosis not present

## 2023-01-04 DIAGNOSIS — K219 Gastro-esophageal reflux disease without esophagitis: Secondary | ICD-10-CM | POA: Diagnosis not present

## 2023-01-04 DIAGNOSIS — Z0000 Encounter for general adult medical examination without abnormal findings: Secondary | ICD-10-CM | POA: Diagnosis not present

## 2023-01-04 DIAGNOSIS — K50019 Crohn's disease of small intestine with unspecified complications: Secondary | ICD-10-CM | POA: Diagnosis not present

## 2023-01-04 DIAGNOSIS — I10 Essential (primary) hypertension: Secondary | ICD-10-CM | POA: Diagnosis not present

## 2023-01-04 DIAGNOSIS — Z1389 Encounter for screening for other disorder: Secondary | ICD-10-CM | POA: Diagnosis not present

## 2023-01-04 DIAGNOSIS — Z125 Encounter for screening for malignant neoplasm of prostate: Secondary | ICD-10-CM | POA: Diagnosis not present

## 2023-01-04 DIAGNOSIS — Z79899 Other long term (current) drug therapy: Secondary | ICD-10-CM | POA: Diagnosis not present

## 2023-01-04 DIAGNOSIS — N400 Enlarged prostate without lower urinary tract symptoms: Secondary | ICD-10-CM | POA: Diagnosis not present

## 2023-01-12 DIAGNOSIS — H5213 Myopia, bilateral: Secondary | ICD-10-CM | POA: Diagnosis not present

## 2023-01-12 DIAGNOSIS — H2513 Age-related nuclear cataract, bilateral: Secondary | ICD-10-CM | POA: Diagnosis not present

## 2023-01-12 DIAGNOSIS — H52223 Regular astigmatism, bilateral: Secondary | ICD-10-CM | POA: Diagnosis not present

## 2023-01-12 DIAGNOSIS — H524 Presbyopia: Secondary | ICD-10-CM | POA: Diagnosis not present

## 2023-01-12 DIAGNOSIS — E119 Type 2 diabetes mellitus without complications: Secondary | ICD-10-CM | POA: Diagnosis not present

## 2023-01-12 DIAGNOSIS — H43393 Other vitreous opacities, bilateral: Secondary | ICD-10-CM | POA: Diagnosis not present

## 2023-01-12 DIAGNOSIS — H43813 Vitreous degeneration, bilateral: Secondary | ICD-10-CM | POA: Diagnosis not present

## 2023-01-12 DIAGNOSIS — H53143 Visual discomfort, bilateral: Secondary | ICD-10-CM | POA: Diagnosis not present

## 2023-04-05 DIAGNOSIS — I10 Essential (primary) hypertension: Secondary | ICD-10-CM | POA: Diagnosis not present

## 2023-04-05 DIAGNOSIS — E1169 Type 2 diabetes mellitus with other specified complication: Secondary | ICD-10-CM | POA: Diagnosis not present

## 2023-04-05 DIAGNOSIS — I739 Peripheral vascular disease, unspecified: Secondary | ICD-10-CM | POA: Diagnosis not present

## 2023-04-08 ENCOUNTER — Emergency Department (HOSPITAL_COMMUNITY)
Admission: EM | Admit: 2023-04-08 | Discharge: 2023-04-08 | Disposition: A | Discharge status: Home / Self Care | Attending: Emergency Medicine | Admitting: Emergency Medicine

## 2023-04-08 ENCOUNTER — Emergency Department (HOSPITAL_COMMUNITY)

## 2023-04-08 ENCOUNTER — Other Ambulatory Visit: Payer: Self-pay

## 2023-04-08 DIAGNOSIS — R109 Unspecified abdominal pain: Secondary | ICD-10-CM | POA: Diagnosis present

## 2023-04-08 DIAGNOSIS — K4090 Unilateral inguinal hernia, without obstruction or gangrene, not specified as recurrent: Secondary | ICD-10-CM | POA: Diagnosis not present

## 2023-04-08 DIAGNOSIS — R911 Solitary pulmonary nodule: Secondary | ICD-10-CM

## 2023-04-08 DIAGNOSIS — K5710 Diverticulosis of small intestine without perforation or abscess without bleeding: Secondary | ICD-10-CM | POA: Diagnosis not present

## 2023-04-08 DIAGNOSIS — K760 Fatty (change of) liver, not elsewhere classified: Secondary | ICD-10-CM | POA: Diagnosis not present

## 2023-04-08 DIAGNOSIS — N281 Cyst of kidney, acquired: Secondary | ICD-10-CM | POA: Diagnosis not present

## 2023-04-08 LAB — COMPREHENSIVE METABOLIC PANEL
ALT: 40 U/L (ref 0–44)
AST: 38 U/L (ref 15–41)
Albumin: 4.3 g/dL (ref 3.5–5.0)
Alkaline Phosphatase: 55 U/L (ref 38–126)
Anion gap: 11 (ref 5–15)
BUN: 19 mg/dL (ref 8–23)
CO2: 24 mmol/L (ref 22–32)
Calcium: 9.3 mg/dL (ref 8.9–10.3)
Chloride: 102 mmol/L (ref 98–111)
Creatinine, Ser: 0.94 mg/dL (ref 0.61–1.24)
GFR, Estimated: >60 mL/min (ref >60)
Glucose, Bld: 145 mg/dL — ABNORMAL HIGH (ref 70–99)
Potassium: 3.6 mmol/L (ref 3.5–5.1)
Sodium: 137 mmol/L (ref 135–145)
Total Bilirubin: 1.3 mg/dL — ABNORMAL HIGH (ref 0.0–1.2)
Total Protein: 7.8 g/dL (ref 6.5–8.1)

## 2023-04-08 LAB — LIPASE, BLOOD: Lipase: 26 U/L (ref 11–51)

## 2023-04-08 LAB — CBC
HCT: 41.4 % (ref 39.0–52.0)
Hemoglobin: 13.4 g/dL (ref 13.0–17.0)
MCH: 29.7 pg (ref 26.0–34.0)
MCHC: 32.4 g/dL (ref 30.0–36.0)
MCV: 91.8 fL (ref 80.0–100.0)
Platelets: 197 10*3/uL (ref 150–400)
RBC: 4.51 MIL/uL (ref 4.22–5.81)
RDW: 13.2 % (ref 11.5–15.5)
WBC: 7.5 10*3/uL (ref 4.0–10.5)
nRBC: 0 % (ref 0.0–0.2)

## 2023-04-08 MED ORDER — IOHEXOL 300 MG/ML  SOLN
100.0000 mL | Freq: Once | INTRAMUSCULAR | Status: AC | PRN
  Administered 2023-04-08: 100 mL via INTRAVENOUS

## 2023-04-08 MED ORDER — HYDROCODONE-ACETAMINOPHEN 5-325 MG PO TABS: 1.0000 | ORAL_TABLET | Freq: Four times a day (QID) | ORAL | 0 refills | Status: DC | PRN

## 2023-04-08 NOTE — ED Provider Notes (Signed)
  Physical Exam  BP (!) 148/79 (BP Location: Left Arm)   Pulse 63   Temp 97.8 F (36.6 C) (Oral)   Resp 16   Ht 5\' 9"  (1.753 m)   Wt 78.5 kg   SpO2 97%   BMI 25.55 kg/m   Physical Exam  Procedures  Procedures  ED Course / MDM    Medical Decision Making Care assumed at 3 pm.  Patient is here with right inguinal pain. Patient has small inguinal hernia on exam. Sign out pending CT ab/pel.   4:18 PM CT showed small inguinal hernia with no obstruction.  Patient does have a lung nodule that can be followed up outpatient.  At this point patient is stable for discharge and can follow-up with surgery outpatient  Problems Addressed: Pulmonary nodule: acute illness or injury Right inguinal hernia: acute illness or injury  Amount and/or Complexity of Data Reviewed Labs: ordered. Decision-making details documented in ED Course. Radiology: ordered and independent interpretation performed. Decision-making details documented in ED Course.  Risk Prescription drug management.          Charlynne Pander, MD 04/08/23 [phone]

## 2023-04-08 NOTE — Discharge Instructions (Addendum)
 You can call Dr. Derrill Memo office this week to set up an appointment for repair of your hernia.  Avoid any heavy lifting.  Take Vicodin as needed for pain  Return to ER if you have severe abdominal pain or vomiting or fever

## 2023-04-08 NOTE — ED Provider Notes (Signed)
  EMERGENCY DEPARTMENT AT [facility] Provider Note   CSN: [id number] Arrival date & time: 04/08/23  1039     History  Chief Complaint  Patient presents with   Abdominal Pain   Inguinal Hernia    Eric Moody is a 73 y.o. male.  This is a 73 year old male here today with a mass in his right groin.  Patient noticed that after he picked up a heavy bag of bird seed 3 days ago.  Went to outpatient clinic today and they recommended come to the emergency room.   Abdominal Pain      Home Medications Prior to Admission medications   Medication Sig Start Date End Date Taking? Authorizing Provider  Adalimumab (HUMIRA) 40 MG/0.4ML PSKT Inject 40 mg into the skin once a week. Friday's    [provider]  amLODipine (NORVASC) 10 MG tablet Take 10 mg by mouth daily. 04/03/21   [provider]  azaTHIOprine (IMURAN) 50 MG tablet Take 150 mg by mouth daily. 03/06/19   [provider]  cetirizine (ZYRTEC) 10 MG tablet Take 10 mg by mouth daily as needed for allergies.    [provider]  Cholecalciferol (VITAMIN D3) 50 MCG (2000 UT) TABS Take 2,000 Units by mouth daily.    [provider]  Coenzyme Q10 200 MG capsule Take 200 mg by mouth daily.    [provider]  Cyanocobalamin (VITAMIN B-12) 5000 MCG TBDP Take 5,000 mcg by mouth daily.    [provider]  FeFum-FePoly-FA-B Cmp-C-Biot (INTEGRA PLUS PO) Take 1 capsule by mouth daily.    [provider]  guaiFENesin (MUCINEX) 600 MG 12 hr tablet Take 600 mg by mouth 2 (two) times daily as needed for cough.    [provider]  ketotifen (ZADITOR) 0.025 % ophthalmic solution Place 1 drop into both eyes daily as needed (allergies/itching).    [provider]  Multiple Vitamins-Minerals (MULTIVITAMIN GUMMIES ADULT PO) Take 1 tablet by mouth daily.    [provider]  ondansetron (ZOFRAN) 4 MG tablet Take 1 tablet (4 mg total) by mouth  every 6 (six) hours. 04/06/21   Darrick Grinder, PA-C  oxyCODONE-acetaminophen (PERCOCET/ROXICET) 5-325 MG tablet Take 1 tablet by mouth every 6 (six) hours as needed for severe pain. 04/06/21   Darrick Grinder, PA-C  pantoprazole (PROTONIX) 40 MG tablet Take 40 mg by mouth daily. 03/06/19   [provider]  Probiotic Product (PROBIOTIC-10 PO) Take 1 tablet by mouth daily.    [provider]  rosuvastatin (CRESTOR) 5 MG tablet Take 5 mg by mouth every Monday, Wednesday, and Friday at 6 PM. 03/12/21   [provider]  telmisartan (MICARDIS) 80 MG tablet Take 80 mg by mouth daily. 03/30/21   [provider]      Allergies    Bean pod extract, Cat dander, Clindamycin hcl, Metformin, Penicillins, Sulfa antibiotics, and Poison ivy extract    Review of Systems   Review of Systems  Gastrointestinal:  Positive for abdominal pain.    Physical Exam Updated Vital Signs BP 136/72 (BP Location: Right Arm)   Pulse 61   Temp 97.7 F (36.5 C)   Resp 18   Ht 5\' 9"  (1.753 m)   Wt 78.5 kg   SpO2 95%   BMI 25.55 kg/m  Physical Exam Vitals reviewed.  Genitourinary:    Comments: Right inguinal hernia present, soft, easily reducible Neurological:     Mental Status: He is alert.  ED Results / Procedures / Treatments   Labs (all labs ordered are listed, but only abnormal results are displayed) Labs Reviewed  COMPREHENSIVE METABOLIC PANEL - Abnormal; Notable for the following components:      Result Value   Glucose, Bld 145 (*)    Total Bilirubin 1.3 (*)    All other components within normal limits  LIPASE, BLOOD  CBC    EKG None  Radiology No results found.  Procedures Procedures    Medications Ordered in ED Medications  iohexol (OMNIPAQUE) 300 MG/ML solution 100 mL (100 mLs Intravenous Contrast Given 04/08/23 1342)    ED Course/ Medical Decision Making/ A&P                                 Medical Decision Making 72 year old male here  today with mass in the right groin.  Plan-patient with a soft, easily reducible inguinal hernia.  Imaging ordered to facilitate patient's outpatient surgical follow-up.  I reviewed the patient's medications and appointment.  I reviewed the patient's medication PCP note.  Patient was signed out to Dr. Silverio Lay pending formal CT read.  Amount and/or Complexity of Data Reviewed Labs: ordered. Radiology: ordered.  Risk Prescription drug management.           Final Clinical Impression(s) / ED Diagnoses Final diagnoses:  Right inguinal hernia    Rx / DC Orders ED Discharge Orders     None         Anders Simmonds T, DO 04/08/23 1507

## 2023-04-08 NOTE — ED Triage Notes (Signed)
 Pt arrived via POV. Has inguinal hernia that showed up 3x days ago. Non-reducible, and painful

## 2023-04-19 ENCOUNTER — Ambulatory Visit: Payer: Self-pay | Admitting: Surgery

## 2023-04-19 DIAGNOSIS — K4090 Unilateral inguinal hernia, without obstruction or gangrene, not specified as recurrent: Secondary | ICD-10-CM | POA: Diagnosis not present

## 2023-04-19 NOTE — H&P (Signed)
 Eric Moody [id number]   Referring Provider:  Self   Subjective   Chief Complaint: New Consultation (hernia)     History of Present Illness: Very pleasant 73 year old man with a history of diabetes, disease status post bowel resection in 2021 at [facility], who presents for evaluation of a right inguinal hernia.  He noticed this a few weeks ago after a day that involved lifting many heavy things.  He had pain and protrusion in the right groin and after evaluation by his primary care doctor, ended up in the emergency room for a CT scan which did show a fat-containing right inguinal hernia.  Since that time he has been taking it easy somewhat and has not had significant pain.  Denies any GI issues beyond his baseline from his Crohn's.  No associated urinary symptoms.  He walks at least 6000 steps a day around his 2 acre property with his [employer] account planner.  He does have a bus trip coming up in early May, but thinks he can do this without any heavy lifting.   Review of Systems: A complete review of systems was obtained from the patient.  I have reviewed this information and discussed as appropriate with the patient.  See HPI as well for other ROS.   Medical History: Past Medical History:  Diagnosis Date   Diabetes mellitus without complication (CMS/HHS-HCC)     There is no problem list on file for this patient.   Past Surgical History:  Procedure Laterality Date   Back surgery     LAPAROSCOPY, SURGICAL; COLECTOMY, PARTIAL, WITH REMOVAL OF TERMINAL ILEUM WITH ILEOCOLOSTOMY       Allergies  Allergen Reactions   Metformin Diarrhea and Other (See Comments)    diarrhea   Sulfa (Sulfonamide Antibiotics) Other (See Comments)    Childhood   Penicillins Hives, Itching, Other (See Comments) and Rash    Did it involve swelling of the face/tongue/throat, SOB, or low BP? Unknown  Did it involve sudden or severe rash/hives, skin peeling, or any reaction on the inside of your mouth or nose?  Unknown  Did you need to seek medical attention at a hospital or doctor's office? Unknown  When did it last happen?      Childhood  If all above answers are "NO", may proceed with cephalosporin use.   Poison Ivy Extract Rash    Current Outpatient Medications on File Prior to Visit  Medication Sig Dispense Refill   adalimumab (HUMIRA,CF,) 40 mg/0.4 mL prefilled syringe kit Inject 40 mg subcutaneously     amLODIPine (NORVASC) 10 MG tablet Take 10 mg by mouth once daily     multivitamin tablet Take 1 tablet by mouth once daily     rosuvastatin (CRESTOR) 5 MG tablet Take 5 mg by mouth     sildenafiL (VIAGRA) 50 MG tablet TAKE ONE TABLET BY MOUTH DAILY AS NEEDED for 30     tamsulosin (FLOMAX) 0.4 mg capsule TAKE ONE CAPSULE BY MOUTH ONCE A DAY for 90     telmisartan (MICARDIS) 80 MG tablet Take 80 mg by mouth once daily     No current facility-administered medications on file prior to visit.    Family History  Problem Relation Age of Onset   High blood pressure (Hypertension) Mother    Hyperlipidemia (Elevated cholesterol) Mother    Coronary Artery Disease (Blocked arteries around heart) Father    Diabetes Father    High blood pressure (Hypertension) Father    Hyperlipidemia (Elevated cholesterol) Father  Stroke Sister    Coronary Artery Disease (Blocked arteries around heart) Sister    Diabetes Sister    High blood pressure (Hypertension) Sister    Hyperlipidemia (Elevated cholesterol) Sister    High blood pressure (Hypertension) Brother    Hyperlipidemia (Elevated cholesterol) Brother    Coronary Artery Disease (Blocked arteries around heart) Brother    Diabetes Brother      Social History   Tobacco Use  Smoking Status Never  Smokeless Tobacco Never     Social History   Socioeconomic History   Marital status: Unknown  Tobacco Use   Smoking status: Never   Smokeless tobacco: Never  Substance and Sexual Activity   Alcohol use: Never   Drug use: Never   Social  Drivers of Health   Food Insecurity: No Food Insecurity (08/10/2019)   Received from [facility]   Hunger Vital Sign    Worried About Running Out of Food in the Last Year: Never true    Ran Out of Food in the Last Year: Never true  Housing Stability: Unknown (04/19/2023)   Housing Stability Vital Sign    Homeless in the Last Year: No    Objective:    Vitals:   04/19/23 1501  BP: 132/78  Pulse: 84  Temp: 36.7 C (98 F)  SpO2: 98%  Weight: 77.5 kg (170 lb 12.8 oz)  Height: 175.3 cm (5\' 9" )  PainSc:   4  PainLoc: Groin    Body mass index is 25.22 kg/m.  Gen: A&Ox3, no distress  Unlabored respirations Abdomen soft and nontender.  There is a moderate partially reducible right inguinal hernia.  No hernia on the left.  Assessment and Plan:  Diagnoses and all orders for this visit:  Non-recurrent unilateral inguinal hernia without obstruction or gangrene    We discussed the relevant anatomy and we discussed options for repair.  I recommend an open approach and went over the technique of the procedure.  Discussed risks of bleeding, infection, pain, scarring, injury to structures in the area including nerves, blood vessels, bowel, bladder, risk of chronic pain, hernia recurrence, risk of seroma or hematoma, urinary retention, and risks of general anesthesia including cardiovascular, pulmonary, and thromboembolic complications.  We discussed typical postop recovery, timeline, and activity limitations.  We also discussed the option of ongoing observation, with high rate of ultimately returning for surgery and risk of increasing size/symptoms from the hernia as well as incarceration/strangulation and went over symptoms that should prompt him to seek emergency treatment.  Questions were welcomed and answered to the patient's satisfaction. Patient wishes to proceed with scheduling.   Talisha Erby Carlye Grippe, MD

## 2023-04-19 NOTE — H&P (View-Only) (Signed)
 Eric Moody [id number]   Referring Provider:  Self   Subjective   Chief Complaint: New Consultation (hernia)     History of Present Illness: Very pleasant 73 year old man with a history of diabetes, disease status post bowel resection in 2021 at [facility], who presents for evaluation of a right inguinal hernia.  He noticed this a few weeks ago after a day that involved lifting many heavy things.  He had pain and protrusion in the right groin and after evaluation by his primary care doctor, ended up in the emergency room for a CT scan which did show a fat-containing right inguinal hernia.  Since that time he has been taking it easy somewhat and has not had significant pain.  Denies any GI issues beyond his baseline from his Crohn's.  No associated urinary symptoms.  He walks at least 6000 steps a day around his 2 acre property with his Advertising account planner.  He does have a bus trip coming up in early May, but thinks he can do this without any heavy lifting.   Review of Systems: A complete review of systems was obtained from the patient.  I have reviewed this information and discussed as appropriate with the patient.  See HPI as well for other ROS.   Medical History: Past Medical History:  Diagnosis Date   Diabetes mellitus without complication (CMS/HHS-HCC)     There is no problem list on file for this patient.   Past Surgical History:  Procedure Laterality Date   Back surgery     LAPAROSCOPY, SURGICAL; COLECTOMY, PARTIAL, WITH REMOVAL OF TERMINAL ILEUM WITH ILEOCOLOSTOMY       Allergies  Allergen Reactions   Metformin Diarrhea and Other (See Comments)    diarrhea   Sulfa (Sulfonamide Antibiotics) Other (See Comments)    Childhood   Penicillins Hives, Itching, Other (See Comments) and Rash    Did it involve swelling of the face/tongue/throat, SOB, or low BP? Unknown  Did it involve sudden or severe rash/hives, skin peeling, or any reaction on the inside of your mouth or nose?  Unknown  Did you need to seek medical attention at a hospital or doctor's office? Unknown  When did it last happen?      Childhood  If all above answers are "NO", may proceed with cephalosporin use.   Poison Ivy Extract Rash    Current Outpatient Medications on File Prior to Visit  Medication Sig Dispense Refill   adalimumab (HUMIRA,CF,) 40 mg/0.4 mL prefilled syringe kit Inject 40 mg subcutaneously     amLODIPine (NORVASC) 10 MG tablet Take 10 mg by mouth once daily     multivitamin tablet Take 1 tablet by mouth once daily     rosuvastatin (CRESTOR) 5 MG tablet Take 5 mg by mouth     sildenafiL (VIAGRA) 50 MG tablet TAKE ONE TABLET BY MOUTH DAILY AS NEEDED for 30     tamsulosin (FLOMAX) 0.4 mg capsule TAKE ONE CAPSULE BY MOUTH ONCE A DAY for 90     telmisartan (MICARDIS) 80 MG tablet Take 80 mg by mouth once daily     No current facility-administered medications on file prior to visit.    Family History  Problem Relation Age of Onset   High blood pressure (Hypertension) Mother    Hyperlipidemia (Elevated cholesterol) Mother    Coronary Artery Disease (Blocked arteries around heart) Father    Diabetes Father    High blood pressure (Hypertension) Father    Hyperlipidemia (Elevated cholesterol) Father  Stroke Sister    Coronary Artery Disease (Blocked arteries around heart) Sister    Diabetes Sister    High blood pressure (Hypertension) Sister    Hyperlipidemia (Elevated cholesterol) Sister    High blood pressure (Hypertension) Brother    Hyperlipidemia (Elevated cholesterol) Brother    Coronary Artery Disease (Blocked arteries around heart) Brother    Diabetes Brother      Social History   Tobacco Use  Smoking Status Never  Smokeless Tobacco Never     Social History   Socioeconomic History   Marital status: Unknown  Tobacco Use   Smoking status: Never   Smokeless tobacco: Never  Substance and Sexual Activity   Alcohol use: Never   Drug use: Never   Social  Drivers of Health   Food Insecurity: No Food Insecurity (08/10/2019)   Received from [facility]   Hunger Vital Sign    Worried About Running Out of Food in the Last Year: Never true    Ran Out of Food in the Last Year: Never true  Housing Stability: Unknown (04/19/2023)   Housing Stability Vital Sign    Homeless in the Last Year: No    Objective:    Vitals:   04/19/23 1501  BP: 132/78  Pulse: 84  Temp: 36.7 C (98 F)  SpO2: 98%  Weight: 77.5 kg (170 lb 12.8 oz)  Height: 175.3 cm (5\' 9" )  PainSc:   4  PainLoc: Groin    Body mass index is 25.22 kg/m.  Gen: A&Ox3, no distress  Unlabored respirations Abdomen soft and nontender.  There is a moderate partially reducible right inguinal hernia.  No hernia on the left.  Assessment and Plan:  Diagnoses and all orders for this visit:  Non-recurrent unilateral inguinal hernia without obstruction or gangrene    We discussed the relevant anatomy and we discussed options for repair.  I recommend an open approach and went over the technique of the procedure.  Discussed risks of bleeding, infection, pain, scarring, injury to structures in the area including nerves, blood vessels, bowel, bladder, risk of chronic pain, hernia recurrence, risk of seroma or hematoma, urinary retention, and risks of general anesthesia including cardiovascular, pulmonary, and thromboembolic complications.  We discussed typical postop recovery, timeline, and activity limitations.  We also discussed the option of ongoing observation, with high rate of ultimately returning for surgery and risk of increasing size/symptoms from the hernia as well as incarceration/strangulation and went over symptoms that should prompt him to seek emergency treatment.  Questions were welcomed and answered to the patient's satisfaction. Patient wishes to proceed with scheduling.   Talisha Erby Carlye Grippe, MD

## 2023-04-20 NOTE — Patient Instructions (Addendum)
 SURGICAL WAITING ROOM VISITATION Patients having surgery or a procedure may have no more than 2 support people in the waiting area - these visitors may rotate.    Children under the age of 49 must have an adult with them who is not the patient.  Due to an increase in RSV and influenza rates and associated hospitalizations, children ages 20 and under may not visit patients in [facility] hospitals.   If the patient needs to stay at the hospital during part of their recovery, the visitor guidelines for inpatient rooms apply. Pre-op nurse will coordinate an appropriate time for 1 support person to accompany patient in pre-op.  This support person may not rotate.    Please refer to the [facility] website for the visitor guidelines for Inpatients (after your surgery is over and you are in a regular room).       Your procedure is scheduled on: 04-27-23   Report to [facility] Main Entrance    Report to admitting at 8:15 AM   Call this number if you have problems the morning of surgery [phone]   Do not eat food or drink liquids :After Midnight.         If you have questions, please contact your surgeon's office.   FOLLOW  ANY ADDITIONAL PRE OP INSTRUCTIONS YOU RECEIVED FROM YOUR SURGEON'S OFFICE!!!     Oral Hygiene is also important to reduce your risk of infection.                                    Remember - BRUSH YOUR TEETH THE MORNING OF SURGERY WITH YOUR REGULAR TOOTHPASTE   Do NOT smoke after Midnight   Take these medicines the morning of surgery with A SIP OF WATER:    Amlodipine   Zyrtec   Omeprazole   Tamsulosin   Okay to use inhalers and eyedrops   Hydrocodone if needed  Stop all vitamins and herbal supplements 7 days before surgery  How to Manage Your Diabetes Before and After Surgery  Why is it important to control my blood sugar before and after surgery? Improving blood sugar levels before and after surgery helps healing and can limit problems. A  way of improving blood sugar control is eating a healthy diet by:  Eating less sugar and carbohydrates  Increasing activity/exercise  Talking with your doctor about reaching your blood sugar goals High blood sugars (greater than 180 mg/dL) can raise your risk of infections and slow your recovery, so you will need to focus on controlling your diabetes during the weeks before surgery. Make sure that the doctor who takes care of your diabetes knows about your planned surgery including the date and location.  How do I manage my blood sugar before surgery? Check your blood sugar at least 4 times a day, starting 2 days before surgery, to make sure that the level is not too high or low. Check your blood sugar the morning of your surgery when you wake up and every 2 hours until you get to the Short Stay unit. If your blood sugar is less than 70 mg/dL, you will need to treat for low blood sugar: Do not take insulin. Treat a low blood sugar (less than 70 mg/dL) with  cup of clear juice (cranberry or apple), 4 glucose tablets, OR glucose gel. Recheck blood sugar in 15 minutes after treatment (to make sure it is  greater than 70 mg/dL). If your blood sugar is not greater than 70 mg/dL on recheck, call [phone] for further instructions. Report your blood sugar to the short stay nurse when you get to Short Stay.  If you are admitted to the hospital after surgery: Your blood sugar will be checked by the staff and you will probably be given insulin after surgery (instead of oral diabetes medicines) to make sure you have good blood sugar levels. The goal for blood sugar control after surgery is 80-180 mg/dL.   WHAT DO I DO ABOUT MY DIABETES MEDICATION?  Do not take oral diabetes medicines (pills) the morning of surgery.  DO NOT TAKE THE FOLLOWING 7 DAYS PRIOR TO SURGERY: Ozempic, Wegovy, Rybelsus (Semaglutide), Byetta (exenatide), Bydureon (exenatide ER), Victoza, Saxenda (liraglutide), or Trulicity  (dulaglutide) Mounjaro (Tirzepatide) Adlyxin (Lixisenatide), Polyethylene Glycol Loxenatide.  Reviewed and Endorsed by [facility] Patient Education Committee, August 2015  Bring CPAP mask and tubing day of surgery.                              You may not have any metal on your body including jewelry, and body piercing             Do not wear  lotions, powders, cologne, or deodorant              Men may shave face and neck.   Do not bring valuables to the hospital. [facility] IS NOT RESPONSIBLE   FOR VALUABLES.   Contacts, dentures or bridgework may not be worn into surgery.   DO NOT BRING YOUR HOME MEDICATIONS TO THE HOSPITAL. PHARMACY WILL DISPENSE MEDICATIONS LISTED ON YOUR MEDICATION LIST TO YOU DURING YOUR ADMISSION IN THE HOSPITAL!    Patients discharged on the day of surgery will not be allowed to drive home.  Someone NEEDS to stay with you for the first 24 hours after anesthesia.   Special Instructions: Bring a copy of your healthcare power of attorney and living will documents the day of surgery if you haven't scanned them before.              Please read over the following fact sheets you were given: IF YOU HAVE QUESTIONS ABOUT YOUR PRE-OP INSTRUCTIONS PLEASE CALL [phone]   If you received a COVID test during your pre-op visit  it is requested that you wear a mask when out in public, stay away from anyone that may not be feeling well and notify your surgeon if you develop symptoms. If you test positive for Covid or have been in contact with anyone that has tested positive in the last 10 days please notify you surgeon.  [facility] - Preparing for Surgery Before surgery, you can play an important role.  Because skin is not sterile, your skin needs to be as free of germs as possible.  You can reduce the number of germs on your skin by washing with CHG (chlorahexidine gluconate) soap before surgery.  CHG is an antiseptic cleaner which kills germs and bonds with the skin to  continue killing germs even after washing. Please DO NOT use if you have an allergy to CHG or antibacterial soaps.  If your skin becomes reddened/irritated stop using the CHG and inform your nurse when you arrive at Short Stay. Do not shave (including legs and underarms) for at least 48 hours prior to the first CHG shower.  You may shave your face/neck.  Please follow these instructions carefully:  1.  Shower with CHG Soap the night before surgery and the  morning of surgery.  2.  If you choose to wash your hair, wash your hair first as usual with your normal  shampoo.  3.  After you shampoo, rinse your hair and body thoroughly to remove the shampoo.                             4.  Use CHG as you would any other liquid soap.  You can apply chg directly to the skin and wash.  Gently with a scrungie or clean washcloth.  5.  Apply the CHG Soap to your body ONLY FROM THE NECK DOWN.   Do   not use on face/ open                           Wound or open sores. Avoid contact with eyes, ears mouth and   genitals (private parts).                       Wash face,  Genitals (private parts) with your normal soap.             6.  Wash thoroughly, paying special attention to the area where your    surgery  will be performed.  7.  Thoroughly rinse your body with warm water from the neck down.  8.  DO NOT shower/wash with your normal soap after using and rinsing off the CHG Soap.                9.  Pat yourself dry with a clean towel.            10.  Wear clean pajamas.            11.  Place clean sheets on your bed the night of your first shower and do not  sleep with pets. Day of Surgery : Do not apply any lotions/deodorants the morning of surgery.  Please wear clean clothes to the hospital/surgery center.  FAILURE TO FOLLOW THESE INSTRUCTIONS MAY RESULT IN THE CANCELLATION OF YOUR SURGERY  PATIENT SIGNATURE_________________________________  NURSE  SIGNATURE__________________________________  ________________________________________________________________________

## 2023-04-21 ENCOUNTER — Encounter (HOSPITAL_COMMUNITY): Payer: Self-pay

## 2023-04-21 ENCOUNTER — Encounter (HOSPITAL_COMMUNITY)
Admission: RE | Admit: 2023-04-21 | Discharge: 2023-04-21 | Disposition: A | Discharge status: Home / Self Care | Source: Ambulatory Visit | Attending: Surgery | Admitting: Surgery

## 2023-04-21 ENCOUNTER — Other Ambulatory Visit: Payer: Self-pay

## 2023-04-21 VITALS — BP 148/86 | HR 64 | Temp 98.2°F | Resp 16 | Ht 69.0 in | Wt 165.0 lb

## 2023-04-21 DIAGNOSIS — R9431 Abnormal electrocardiogram [ECG] [EKG]: Secondary | ICD-10-CM | POA: Diagnosis not present

## 2023-04-21 DIAGNOSIS — Z01810 Encounter for preprocedural cardiovascular examination: Secondary | ICD-10-CM | POA: Diagnosis present

## 2023-04-21 DIAGNOSIS — E119 Type 2 diabetes mellitus without complications: Secondary | ICD-10-CM | POA: Diagnosis not present

## 2023-04-21 DIAGNOSIS — Z01818 Encounter for other preprocedural examination: Secondary | ICD-10-CM | POA: Diagnosis not present

## 2023-04-21 DIAGNOSIS — Z01812 Encounter for preprocedural laboratory examination: Secondary | ICD-10-CM | POA: Diagnosis present

## 2023-04-21 HISTORY — DX: Inflammatory liver disease, unspecified: K75.9

## 2023-04-21 HISTORY — DX: Personal history of urinary calculi: Z87.442

## 2023-04-21 LAB — HEMOGLOBIN A1C
Hgb A1c MFr Bld: 6.2 % — ABNORMAL HIGH (ref 4.8–5.6)
Mean Plasma Glucose: 131.24 mg/dL

## 2023-04-21 LAB — GLUCOSE, CAPILLARY: Glucose-Capillary: 128 mg/dL — ABNORMAL HIGH (ref 70–99)

## 2023-04-21 NOTE — Progress Notes (Addendum)
 PCP - Eric Neighbor, MD  [facility] Cardiologist - no  PPM/ICD -  Device Orders -  Rep Notified -   Chest x-ray -  EKG - 04-21-23 epic Stress Test -  ECHO -  Cardiac Cath -   Sleep Study -  CPAP -   Fasting Blood Sugar -  Checks Blood Sugar __0___ times a day  Blood Thinner Instructions: Aspirin Instructions:n/a  ERAS Protcol - PRE-SURGERY n/a   COVID vaccine -yes  Activity--Able to climb a flight of stairs without CP or SOB Anesthesia review: HTN,DM,Anemia, chron's disease, RBBB new since last tracing  Patient denies shortness of breath, fever, cough and chest pain at PAT appointment   All instructions explained to the patient, with a verbal understanding of the material. Patient agrees to go over the instructions while at home for a better understanding. Patient also instructed to self quarantine after being tested for COVID-19. The opportunity to ask questions was provided.

## 2023-04-25 DIAGNOSIS — N400 Enlarged prostate without lower urinary tract symptoms: Secondary | ICD-10-CM | POA: Diagnosis not present

## 2023-04-25 DIAGNOSIS — I739 Peripheral vascular disease, unspecified: Secondary | ICD-10-CM | POA: Diagnosis not present

## 2023-04-25 DIAGNOSIS — I10 Essential (primary) hypertension: Secondary | ICD-10-CM | POA: Diagnosis not present

## 2023-04-25 DIAGNOSIS — E1169 Type 2 diabetes mellitus with other specified complication: Secondary | ICD-10-CM | POA: Diagnosis not present

## 2023-04-27 ENCOUNTER — Encounter (HOSPITAL_COMMUNITY): Payer: Self-pay | Admitting: Surgery

## 2023-04-27 ENCOUNTER — Encounter (HOSPITAL_COMMUNITY)
Admission: RE | Disposition: A | Discharge status: Home / Self Care | Payer: Self-pay | Source: Home / Self Care | Attending: Surgery

## 2023-04-27 ENCOUNTER — Ambulatory Visit (HOSPITAL_BASED_OUTPATIENT_CLINIC_OR_DEPARTMENT_OTHER): Admitting: Anesthesiology

## 2023-04-27 ENCOUNTER — Ambulatory Visit (HOSPITAL_COMMUNITY)
Admission: RE | Admit: 2023-04-27 | Discharge: 2023-04-27 | Disposition: A | Discharge status: Home / Self Care | Attending: Surgery | Admitting: Surgery

## 2023-04-27 ENCOUNTER — Ambulatory Visit (HOSPITAL_COMMUNITY): Payer: Self-pay | Admitting: Physician Assistant

## 2023-04-27 ENCOUNTER — Other Ambulatory Visit: Payer: Self-pay

## 2023-04-27 DIAGNOSIS — K4090 Unilateral inguinal hernia, without obstruction or gangrene, not specified as recurrent: Secondary | ICD-10-CM | POA: Insufficient documentation

## 2023-04-27 DIAGNOSIS — E119 Type 2 diabetes mellitus without complications: Secondary | ICD-10-CM

## 2023-04-27 DIAGNOSIS — Z79899 Other long term (current) drug therapy: Secondary | ICD-10-CM | POA: Insufficient documentation

## 2023-04-27 DIAGNOSIS — K5090 Crohn's disease, unspecified, without complications: Secondary | ICD-10-CM | POA: Insufficient documentation

## 2023-04-27 DIAGNOSIS — Z8249 Family history of ischemic heart disease and other diseases of the circulatory system: Secondary | ICD-10-CM | POA: Diagnosis not present

## 2023-04-27 DIAGNOSIS — I10 Essential (primary) hypertension: Secondary | ICD-10-CM

## 2023-04-27 DIAGNOSIS — Z833 Family history of diabetes mellitus: Secondary | ICD-10-CM | POA: Insufficient documentation

## 2023-04-27 DIAGNOSIS — K219 Gastro-esophageal reflux disease without esophagitis: Secondary | ICD-10-CM | POA: Diagnosis not present

## 2023-04-27 HISTORY — PX: INGUINAL HERNIA REPAIR: SHX194

## 2023-04-27 LAB — GLUCOSE, CAPILLARY
Glucose-Capillary: 150 mg/dL — ABNORMAL HIGH (ref 70–99)
Glucose-Capillary: 155 mg/dL — ABNORMAL HIGH (ref 70–99)

## 2023-04-27 SURGERY — REPAIR, HERNIA, INGUINAL, ADULT
Anesthesia: General | Laterality: Right

## 2023-04-27 MED ORDER — EPHEDRINE 5 MG/ML INJ
INTRAVENOUS | Status: AC
  Filled 2023-04-27: qty 5

## 2023-04-27 MED ORDER — BUPIVACAINE LIPOSOME 1.3 % IJ SUSP
INTRAMUSCULAR | Status: DC | PRN
  Administered 2023-04-27: 20 mL

## 2023-04-27 MED ORDER — CEFAZOLIN SODIUM-DEXTROSE 2-4 GM/100ML-% IV SOLN
2.0000 g | INTRAVENOUS | Status: AC
  Administered 2023-04-27: 2 g via INTRAVENOUS
  Filled 2023-04-27: qty 100

## 2023-04-27 MED ORDER — FENTANYL CITRATE (PF) 100 MCG/2ML IJ SOLN
INTRAMUSCULAR | Status: DC | PRN
  Administered 2023-04-27 (×2): 25 ug via INTRAVENOUS

## 2023-04-27 MED ORDER — DOCUSATE SODIUM 100 MG PO CAPS: 100.0000 mg | ORAL_CAPSULE | Freq: Two times a day (BID) | ORAL | 0 refills | Status: AC

## 2023-04-27 MED ORDER — LIDOCAINE 2% (20 MG/ML) 5 ML SYRINGE: INTRAMUSCULAR | Status: DC | PRN

## 2023-04-27 MED ORDER — DEXAMETHASONE SODIUM PHOSPHATE 10 MG/ML IJ SOLN
INTRAMUSCULAR | Status: AC
  Filled 2023-04-27: qty 1

## 2023-04-27 MED ORDER — MIDAZOLAM HCL 2 MG/2ML IJ SOLN
INTRAMUSCULAR | Status: DC | PRN
  Administered 2023-04-27: 2 mg via INTRAVENOUS

## 2023-04-27 MED ORDER — OXYCODONE HCL 5 MG PO TABS: 5.0000 mg | ORAL_TABLET | Freq: Three times a day (TID) | ORAL | 0 refills | Status: AC | PRN

## 2023-04-27 MED ORDER — 0.9 % SODIUM CHLORIDE (POUR BTL) OPTIME
TOPICAL | Status: DC | PRN
  Administered 2023-04-27: 1000 mL

## 2023-04-27 MED ORDER — DEXAMETHASONE SODIUM PHOSPHATE 10 MG/ML IJ SOLN
INTRAMUSCULAR | Status: DC | PRN
  Administered 2023-04-27: 4 mg via INTRAVENOUS

## 2023-04-27 MED ORDER — PROPOFOL 10 MG/ML IV BOLUS
INTRAVENOUS | Status: DC | PRN
  Administered 2023-04-27: 150 mg via INTRAVENOUS

## 2023-04-27 MED ORDER — HYDROMORPHONE HCL 1 MG/ML IJ SOLN: 0.2500 mg | INTRAMUSCULAR | Status: DC | PRN

## 2023-04-27 MED ORDER — ACETAMINOPHEN 325 MG PO TABS: 650.0000 mg | ORAL_TABLET | ORAL | Status: DC | PRN

## 2023-04-27 MED ORDER — FENTANYL CITRATE PF 50 MCG/ML IJ SOSY: 25.0000 ug | PREFILLED_SYRINGE | INTRAMUSCULAR | Status: DC | PRN

## 2023-04-27 MED ORDER — INSULIN ASPART 100 UNIT/ML IJ SOLN: 0.0000 [IU] | INTRAMUSCULAR | Status: DC | PRN

## 2023-04-27 MED ORDER — CHLORHEXIDINE GLUCONATE 4 % EX SOLN: 60.0000 mL | Freq: Once | CUTANEOUS | Status: DC

## 2023-04-27 MED ORDER — CHLORHEXIDINE GLUCONATE 0.12 % MT SOLN
15.0000 mL | Freq: Once | OROMUCOSAL | Status: AC
  Administered 2023-04-27: 15 mL via OROMUCOSAL

## 2023-04-27 MED ORDER — OXYCODONE HCL 5 MG PO TABS: 5.0000 mg | ORAL_TABLET | ORAL | Status: DC | PRN

## 2023-04-27 MED ORDER — GABAPENTIN 300 MG PO CAPS
ORAL_CAPSULE | ORAL | Status: AC
  Filled 2023-04-27: qty 1

## 2023-04-27 MED ORDER — BUPIVACAINE LIPOSOME 1.3 % IJ SUSP: 20.0000 mL | Freq: Once | INTRAMUSCULAR | Status: DC

## 2023-04-27 MED ORDER — EPHEDRINE SULFATE-NACL 50-0.9 MG/10ML-% IV SOSY
PREFILLED_SYRINGE | INTRAVENOUS | Status: DC | PRN
  Administered 2023-04-27: 7.5 mg via INTRAVENOUS

## 2023-04-27 MED ORDER — ACETAMINOPHEN 500 MG PO TABS
ORAL_TABLET | ORAL | Status: AC
  Filled 2023-04-27: qty 2

## 2023-04-27 MED ORDER — BUPIVACAINE LIPOSOME 1.3 % IJ SUSP
INTRAMUSCULAR | Status: AC
  Filled 2023-04-27: qty 20

## 2023-04-27 MED ORDER — BUPIVACAINE-EPINEPHRINE (PF) 0.25% -1:200000 IJ SOLN
INTRAMUSCULAR | Status: AC
  Filled 2023-04-27: qty 30

## 2023-04-27 MED ORDER — ONDANSETRON HCL 4 MG/2ML IJ SOLN
INTRAMUSCULAR | Status: AC
  Filled 2023-04-27: qty 2

## 2023-04-27 MED ORDER — HYDROMORPHONE HCL 1 MG/ML IJ SOLN
INTRAMUSCULAR | Status: AC
  Administered 2023-04-27: 0.5 mg via INTRAVENOUS
  Filled 2023-04-27: qty 1

## 2023-04-27 MED ORDER — FENTANYL CITRATE (PF) 100 MCG/2ML IJ SOLN
INTRAMUSCULAR | Status: AC
  Filled 2023-04-27: qty 2

## 2023-04-27 MED ORDER — LACTATED RINGERS IV SOLN: INTRAVENOUS | Status: DC

## 2023-04-27 MED ORDER — PROPOFOL 10 MG/ML IV BOLUS
INTRAVENOUS | Status: AC
  Filled 2023-04-27: qty 20

## 2023-04-27 MED ORDER — MIDAZOLAM HCL 2 MG/2ML IJ SOLN
INTRAMUSCULAR | Status: AC
  Filled 2023-04-27: qty 2

## 2023-04-27 MED ORDER — LIDOCAINE HCL (PF) 2 % IJ SOLN
INTRAMUSCULAR | Status: DC | PRN
  Administered 2023-04-27: 40 mg via INTRADERMAL

## 2023-04-27 MED ORDER — BUPIVACAINE-EPINEPHRINE 0.25% -1:200000 IJ SOLN
INTRAMUSCULAR | Status: DC | PRN
  Administered 2023-04-27: 30 mL

## 2023-04-27 MED ORDER — ORAL CARE MOUTH RINSE: 15.0000 mL | Freq: Once | OROMUCOSAL | Status: AC

## 2023-04-27 MED ORDER — GABAPENTIN 300 MG PO CAPS
300.0000 mg | ORAL_CAPSULE | ORAL | Status: AC
  Administered 2023-04-27: 300 mg via ORAL

## 2023-04-27 MED ORDER — LIDOCAINE HCL (PF) 2 % IJ SOLN
INTRAMUSCULAR | Status: AC
  Filled 2023-04-27: qty 5

## 2023-04-27 MED ORDER — ACETAMINOPHEN 500 MG PO TABS
1000.0000 mg | ORAL_TABLET | ORAL | Status: AC
  Administered 2023-04-27: 1000 mg via ORAL

## 2023-04-27 MED ORDER — ONDANSETRON HCL 4 MG/2ML IJ SOLN
INTRAMUSCULAR | Status: DC | PRN
  Administered 2023-04-27: 4 mg via INTRAVENOUS

## 2023-04-27 MED ORDER — ACETAMINOPHEN 650 MG RE SUPP: 650.0000 mg | RECTAL | Status: DC | PRN

## 2023-04-27 SURGICAL SUPPLY — 29 items
BAG COUNTER SPONGE SURGICOUNT (BAG) IMPLANT
BENZOIN TINCTURE PRP APPL 2/3 (GAUZE/BANDAGES/DRESSINGS) ×1 IMPLANT
BLADE SURG 15 STRL LF DISP TIS (BLADE) ×1 IMPLANT
CHLORAPREP W/TINT 26 (MISCELLANEOUS) ×1 IMPLANT
CLSR STERI-STRIP ANTIMIC 1/2X4 (GAUZE/BANDAGES/DRESSINGS) IMPLANT
COVER SURGICAL LIGHT HANDLE (MISCELLANEOUS) ×1 IMPLANT
DRAIN PENROSE 0.5X18 (DRAIN) ×1 IMPLANT
DRAPE LAPAROSCOPIC ABDOMINAL (DRAPES) ×1 IMPLANT
ELECT REM PT RETURN 15FT ADLT (MISCELLANEOUS) ×1 IMPLANT
GAUZE SPONGE 4X4 12PLY STRL (GAUZE/BANDAGES/DRESSINGS) IMPLANT
GLOVE BIO SURGEON STRL SZ 6 (GLOVE) ×1 IMPLANT
GLOVE INDICATOR 6.5 STRL GRN (GLOVE) ×1 IMPLANT
GOWN STRL REUS W/ TWL LRG LVL3 (GOWN DISPOSABLE) ×1 IMPLANT
KIT BASIN OR (CUSTOM PROCEDURE TRAY) ×1 IMPLANT
KIT TURNOVER KIT A (KITS) IMPLANT
MARKER SKIN DUAL TIP RULER LAB (MISCELLANEOUS) ×1 IMPLANT
MESH ULTRAPRO 3X6 7.6X15CM (Mesh General) IMPLANT
NDL HYPO 22X1.5 SAFETY MO (MISCELLANEOUS) ×1 IMPLANT
NEEDLE HYPO 22X1.5 SAFETY MO (MISCELLANEOUS) ×1 IMPLANT
PACK GENERAL/GYN (CUSTOM PROCEDURE TRAY) ×1 IMPLANT
SPIKE FLUID TRANSFER (MISCELLANEOUS) ×1 IMPLANT
STRIP CLOSURE SKIN 1/2X4 (GAUZE/BANDAGES/DRESSINGS) ×1 IMPLANT
SUT ETHIBOND 0 MO6 C/R (SUTURE) ×1 IMPLANT
SUT MNCRL AB 4-0 PS2 18 (SUTURE) ×1 IMPLANT
SUT PDS AB 0 CT1 36 (SUTURE) ×2 IMPLANT
SUT VIC AB 3-0 SH 27XBRD (SUTURE) ×2 IMPLANT
SUT VICRYL 3 0 BR 18 UND (SUTURE) ×1 IMPLANT
SYR CONTROL 10ML LL (SYRINGE) ×1 IMPLANT
TOWEL OR 17X26 10 PK STRL BLUE (TOWEL DISPOSABLE) ×1 IMPLANT

## 2023-04-27 NOTE — Transfer of Care (Signed)
 Immediate Anesthesia Transfer of Care Note  Patient: Eric Moody  Procedure(s) Performed: REPAIR, HERNIA, INGUINAL, ADULT (Right)  Patient Location: PACU  Anesthesia Type:General  Level of Consciousness: drowsy  Airway & Oxygen Therapy: Patient Spontanous Breathing and Patient connected to face mask oxygen  Post-op Assessment: Report given to RN, Post -op Vital signs reviewed and stable, and Patient moving all extremities X 4  Post vital signs: Reviewed and stable  Last Vitals:  Vitals Value Taken Time  BP 128/64   Temp    Pulse 65 04/27/23 1116  Resp 12   SpO2 99 % 04/27/23 1116  Vitals shown include unfiled device data.  Last Pain:  Vitals:   04/27/23 0843  TempSrc:   PainSc: 6       Patients Stated Pain Goal: 0 (04/27/23 0843)  Complications: No notable events documented.

## 2023-04-27 NOTE — Anesthesia Postprocedure Evaluation (Signed)
 Anesthesia Post Note  Patient: Eric Moody  Procedure(s) Performed: REPAIR, HERNIA, INGUINAL, ADULT (Right)     Patient location during evaluation: PACU Anesthesia Type: General Level of consciousness: awake and alert Pain management: pain level controlled Vital Signs Assessment: post-procedure vital signs reviewed and stable Respiratory status: spontaneous breathing, nonlabored ventilation, respiratory function stable and patient connected to nasal cannula oxygen Cardiovascular status: blood pressure returned to baseline and stable Postop Assessment: no apparent nausea or vomiting Anesthetic complications: no   No notable events documented.  Last Vitals:  Vitals:   04/27/23 1230 04/27/23 1250  BP: (!) 144/71 131/70  Pulse: 64 68  Resp: 14   Temp: 36.6 C 36.6 C  SpO2: 99% 100%    Last Pain:  Vitals:   04/27/23 1230  TempSrc:   PainSc: 6                  Zayana Salvador P Kashaun Bebo

## 2023-04-27 NOTE — Op Note (Signed)
 Operative Note  Chazz ELBY BLACKWELDER  [id number]  [id number]  04/27/2023   Surgeon: Phylliss Blakes MD FACS   Procedure performed: Open right inguinal hernia repair with mesh   Preop diagnosis:  right inguinal hernia   Post-op diagnosis/intraop findings: indirect inguinal hernia   Specimens: none   EBL: 5cc   Complications: none   Description of procedure: After confirming informed consent, the patient was taken to the operating room and placed supine on operating room table where general anesthesia was initiated, preoperative antibiotics were administered, SCDs applied, and a formal timeout was performed. The groin was clipped, prepped and draped in the usual sterile fashion. An oblique incision was made the just above the inguinal ligament after infiltrating the tissues with local anesthetic (exparel mixed with 0.25% marcaine with epinephrine). Soft tissues were dissected using electrocautery until the external oblique aponeurosis was encountered. This was divided sharply to expand the external ring. A plane was bluntly developed between the spermatic cord and the external oblique. The spermatic cord was then bluntly dissected away from the pubic tubercle and encircled with a Penrose. Inspection of the inguinal anatomy revealed a moderate indirect sac and a small cord lipoma. The later was excised at the level of the internal ring and the pedicle ligated with a 3-0 vicryl tie. The indirect hernia sac was bluntly dissected away from the cord structures and skeletonized to the level of the internal ring, where it was reduced intact into the abdomen.  The inguinal floor was reconstructed with interrupted 0 PDS, leaving an internal ring just sufficient for the cord structures. A 3 x 6 piece of ultra Pro mesh was brought onto the field and trimmed to approximate the field. This was sutured to the pubic tubercle fascia, inferior shelving edge and to the internal oblique superiorly with interrupted 0 ethibonds.  The tails of the mesh were wrapped around the spermatic cord, ensuring adequate room for the cord, and sutured to each other with 0 ethibond, and then directed laterally to lie flat beneath the external oblique aponeurosis. An additional suture was placed medially to reinforce the slit in the mesh. Hemostasis was ensured within the wound. The Penrose was removed. The external oblique aponeurosis was reapproximated with a running 3-0 Vicryl to re-create a narrowed external ring. More local was infiltrated around the pubic tubercle and in the plane just below the external oblique. The Scarpa's was reapproximated with interrupted 3-0 Vicryls. The skin was closed with a running subcuticular 4-0 Monocryl. The remainder of the local was injected in the subcutaneous and subcuticular space. The field was then cleaned, benzoin and Steri-Strips and sterile bandage were applied. The patient was then awakened extubated and taken to PACU in stable condition.    All counts were correct at the completion of the case

## 2023-04-27 NOTE — Anesthesia Preprocedure Evaluation (Signed)
 Anesthesia Evaluation  Patient identified by MRN, date of birth, ID band Patient awake    Reviewed: Allergy & Precautions, NPO status , Patient's Chart, lab work & pertinent test results  Airway Mallampati: II  TM Distance: >3 FB Neck ROM: Full    Dental no notable dental hx.    Pulmonary neg pulmonary ROS   Pulmonary exam normal        Cardiovascular hypertension, Pt. on medications  Rhythm:Regular Rate:Normal     Neuro/Psych negative neurological ROS  negative psych ROS   GI/Hepatic ,GERD  Medicated,,Inguinal hernia    Endo/Other  diabetes, Type 2    Renal/GU negative Renal ROS  negative genitourinary   Musculoskeletal negative musculoskeletal ROS (+)    Abdominal Normal abdominal exam  (+)   Peds  Hematology  (+) Blood dyscrasia, anemia   Anesthesia Other Findings   Reproductive/Obstetrics                             Anesthesia Physical Anesthesia Plan  ASA: 2  Anesthesia Plan: General   Post-op Pain Management: Tylenol PO (pre-op)* and Gabapentin PO (pre-op)*   Induction: Intravenous  PONV Risk Score and Plan: 2 and Ondansetron, Dexamethasone and Treatment may vary due to age or medical condition  Airway Management Planned: Mask and Oral ETT  Additional Equipment: None  Intra-op Plan:   Post-operative Plan: Extubation in OR  Informed Consent: I have reviewed the patients History and Physical, chart, labs and discussed the procedure including the risks, benefits and alternatives for the proposed anesthesia with the patient or authorized representative who has indicated his/her understanding and acceptance.     Dental advisory given  Plan Discussed with: CRNA  Anesthesia Plan Comments:        Anesthesia Quick Evaluation

## 2023-04-27 NOTE — Interval H&P Note (Signed)
 History and Physical Interval Note:  04/27/2023 8:24 AM  Eric Moody  has presented today for surgery, with the diagnosis of HERNIA.  The various methods of treatment have been discussed with the patient and family. After consideration of risks, benefits and other options for treatment, the patient has consented to  Procedure(s) with comments: REPAIR, HERNIA, INGUINAL, ADULT (Right) - WITH MESH as a surgical intervention.  The patient's history has been reviewed, patient examined, no change in status, stable for surgery.  I have reviewed the patient's chart and labs.  Questions were answered to the patient's satisfaction.     [name] Lollie Sails

## 2023-04-27 NOTE — Anesthesia Procedure Notes (Signed)
 Procedure Name: LMA Insertion Date/Time: 04/27/2023 10:04 AM  Performed by: Nelle Don, CRNAPre-anesthesia Checklist: Patient identified, Emergency Drugs available, Suction available and Patient being monitored Patient Re-evaluated:Patient Re-evaluated prior to induction Oxygen Delivery Method: Circle system utilized Preoxygenation: Pre-oxygenation with 100% oxygen Induction Type: IV induction LMA: LMA with gastric port inserted LMA Size: 4.0 Number of attempts: 1 Dental Injury: Teeth and Oropharynx as per pre-operative assessment

## 2023-04-27 NOTE — Discharge Instructions (Addendum)
 HERNIA REPAIR: POST OP INSTRUCTIONS   EAT Gradually transition to a high fiber diet with a fiber supplement over the next few weeks after discharge.  WALK Walk an hour a day (cumulative- not all at once).  Control your pain to do that.    CONTROL PAIN Control pain so that you can walk, sleep, tolerate sneezing/coughing, and go up/down stairs.  HAVE A BOWEL MOVEMENT DAILY Keep your bowels regular to avoid problems.  OK to try a laxative to override constipation.  OK to use an antidiarrheal to slow down diarrhea.  Call if not better after 2 tries  CALL IF YOU HAVE PROBLEMS/CONCERNS Call if you are still struggling despite following these instructions. Call if you have concerns not answered by these instructions  ######################################################################    DIET: Follow a light bland diet & liquids the first 24 hours after arrival home, such as soup, liquids, starches, etc.  Be sure to drink plenty of fluids.  Quickly advance to a usual solid diet within a few days.  Avoid fast food or heavy meals initially as you are more likely to get nauseated or have irregular bowels.    Take your usually prescribed home medications unless otherwise directed.  PAIN CONTROL: Pain is best controlled by a usual combination of three different methods TOGETHER: Ice/Heat Over the counter pain medication Prescription pain medication Most patients will experience some swelling and bruising around the hernia(s) such as the bellybutton, groins, or old incisions.  Ice packs or heating pads (30-60 minutes up to 6 times a day) will help. Use ice for the first few days to help decrease swelling and bruising, then switch to heat to help relax tight/sore spots and speed recovery.  Some people prefer to use ice alone, heat alone, alternating between ice & heat.  Experiment to what works for you.  Swelling and bruising can take several weeks to resolve.   It is helpful to take an  over-the-counter pain medication regularly for the first days: Naproxen (Aleve, etc)  Two 220mg  tabs twice a day OR Ibuprofen (Advil, etc) Three 200mg  tabs four times a day (every meal & bedtime) AND Acetaminophen (Tylenol, etc) 325-650mg  four times a day (every meal & bedtime) A  prescription for pain medication should be given to you upon discharge.  Take your pain medication as prescribed, IF NEEDED.  If you are having problems/concerns with the prescription medicine (does not control pain, nausea, vomiting, rash, itching, etc), please call us [phone] to see if we need to switch you to a different pain medicine that will work better for you and/or control your side effect better. If you need a refill on your pain medication, please contact your pharmacy.  They will contact our office to request authorization. Prescriptions will not be filled after 5 pm or on week-ends.  Avoid getting constipated.  Between the surgery and the pain medications, it is common to experience some constipation.  Increasing fluid intake and taking a fiber supplement (such as Metamucil, Citrucel, FiberCon, MiraLax, etc) 1-2 times a day regularly will usually help prevent this problem from occurring.  A mild laxative (prune juice, Milk of Magnesia, MiraLax, etc) should be taken according to package directions if there are no bowel movements after 48 hours.    Wash / shower every day, starting 2 days after surgery.  You may shower over the steri strips or skin glue which are waterproof.  No rubbing, scrubbing, lotions or ointments to incision(s). Do not soak or submerge  incision.   Remove your outer bandage 2 days after surgery. Steri strips (white tapes) will peel off after 1-2 weeks. You may leave the incision open to air.  You may replace a dressing/Band-Aid to cover an incision for comfort if you wish.  Continue to shower over incision(s) after the dressing is off.  ACTIVITIES as tolerated:   You may resume regular  (light) daily activities beginning the next day--such as daily self-care, walking, climbing stairs--gradually increasing activities as tolerated.  Control your pain so that you can walk an hour a day.  If you can walk 30 minutes without difficulty, it is safe to try more intense activity such as jogging, treadmill, bicycling, low-impact aerobics, swimming, etc. Refrain from the most intensive and strenuous activity such as sit-ups, heavy lifting, contact sports, etc  Refrain from any heavy lifting or straining (20 pounds limit) until 6 weeks after surgery.   DO NOT PUSH THROUGH PAIN.  Let pain be your guide: If it hurts to do something, don't do it.  Pain is your body warning you to avoid that activity for another week until the pain goes down. You may drive when you are no longer taking prescription pain medication, you can comfortably wear a seatbelt, and you can safely maneuver your car and apply brakes. You may have sexual intercourse when it is comfortable.   FOLLOW UP in our office Please call [facility] at [phone] to set up an appointment to see your surgeon in the office for a follow-up appointment approximately 2-3 weeks after your surgery. Make sure that you call for this appointment the day you arrive home to insure a convenient appointment time.  9.  If you have disability of FMLA / Family leave forms, please bring the forms to the office for processing.  (do not give to your surgeon).  WHEN TO CALL us [phone]: Poor pain control Reactions / problems with new medications (rash/itching, nausea, etc)  Fever over 101.5 F (38.5 C) Inability to urinate Nausea and/or vomiting Worsening swelling or bruising Continued bleeding from incision. Increased pain, redness, or drainage from the incision   The clinic staff is available to answer your questions during regular business hours (8:30am-5pm).  Please don't hesitate to call and ask to speak to one of our nurses for clinical concerns.    If you have a medical emergency, go to the nearest emergency room or call 911.  A surgeon from [facility] is always on call at the hospitals in [facility], [place] [address], [place], [place]  [address] ?  [address] [address], [place], [place]   [address] MAIN: [phone] ? TOLL FREE: [phone] ? FAX: [phone] [email or web address]

## 2023-04-28 ENCOUNTER — Encounter (HOSPITAL_COMMUNITY): Payer: Self-pay | Admitting: Surgery

## 2023-05-04 DIAGNOSIS — E1169 Type 2 diabetes mellitus with other specified complication: Secondary | ICD-10-CM | POA: Diagnosis not present

## 2023-05-04 DIAGNOSIS — I10 Essential (primary) hypertension: Secondary | ICD-10-CM | POA: Diagnosis not present

## 2023-05-04 DIAGNOSIS — I739 Peripheral vascular disease, unspecified: Secondary | ICD-10-CM | POA: Diagnosis not present

## 2023-05-25 DIAGNOSIS — I739 Peripheral vascular disease, unspecified: Secondary | ICD-10-CM | POA: Diagnosis not present

## 2023-05-25 DIAGNOSIS — I10 Essential (primary) hypertension: Secondary | ICD-10-CM | POA: Diagnosis not present

## 2023-05-25 DIAGNOSIS — E1169 Type 2 diabetes mellitus with other specified complication: Secondary | ICD-10-CM | POA: Diagnosis not present

## 2023-05-25 DIAGNOSIS — N400 Enlarged prostate without lower urinary tract symptoms: Secondary | ICD-10-CM | POA: Diagnosis not present

## 2023-06-03 DIAGNOSIS — I739 Peripheral vascular disease, unspecified: Secondary | ICD-10-CM | POA: Diagnosis not present

## 2023-06-03 DIAGNOSIS — E1169 Type 2 diabetes mellitus with other specified complication: Secondary | ICD-10-CM | POA: Diagnosis not present

## 2023-06-03 DIAGNOSIS — I10 Essential (primary) hypertension: Secondary | ICD-10-CM | POA: Diagnosis not present

## 2023-06-25 DIAGNOSIS — I739 Peripheral vascular disease, unspecified: Secondary | ICD-10-CM | POA: Diagnosis not present

## 2023-06-25 DIAGNOSIS — I10 Essential (primary) hypertension: Secondary | ICD-10-CM | POA: Diagnosis not present

## 2023-06-25 DIAGNOSIS — N400 Enlarged prostate without lower urinary tract symptoms: Secondary | ICD-10-CM | POA: Diagnosis not present

## 2023-06-25 DIAGNOSIS — E1169 Type 2 diabetes mellitus with other specified complication: Secondary | ICD-10-CM | POA: Diagnosis not present

## 2023-06-27 DIAGNOSIS — K50019 Crohn's disease of small intestine with unspecified complications: Secondary | ICD-10-CM | POA: Diagnosis not present

## 2023-07-03 DIAGNOSIS — I739 Peripheral vascular disease, unspecified: Secondary | ICD-10-CM | POA: Diagnosis not present

## 2023-07-03 DIAGNOSIS — I10 Essential (primary) hypertension: Secondary | ICD-10-CM | POA: Diagnosis not present

## 2023-07-03 DIAGNOSIS — E1169 Type 2 diabetes mellitus with other specified complication: Secondary | ICD-10-CM | POA: Diagnosis not present

## 2023-07-13 DIAGNOSIS — R058 Other specified cough: Secondary | ICD-10-CM | POA: Diagnosis not present

## 2023-07-13 DIAGNOSIS — E1169 Type 2 diabetes mellitus with other specified complication: Secondary | ICD-10-CM | POA: Diagnosis not present

## 2023-07-13 DIAGNOSIS — E739 Lactose intolerance, unspecified: Secondary | ICD-10-CM | POA: Diagnosis not present

## 2023-07-13 DIAGNOSIS — K219 Gastro-esophageal reflux disease without esophagitis: Secondary | ICD-10-CM | POA: Diagnosis not present

## 2023-07-13 DIAGNOSIS — K50019 Crohn's disease of small intestine with unspecified complications: Secondary | ICD-10-CM | POA: Diagnosis not present

## 2023-07-13 DIAGNOSIS — I739 Peripheral vascular disease, unspecified: Secondary | ICD-10-CM | POA: Diagnosis not present

## 2023-07-13 DIAGNOSIS — N400 Enlarged prostate without lower urinary tract symptoms: Secondary | ICD-10-CM | POA: Diagnosis not present

## 2023-07-13 DIAGNOSIS — J4540 Moderate persistent asthma, uncomplicated: Secondary | ICD-10-CM | POA: Diagnosis not present

## 2023-07-13 DIAGNOSIS — I10 Essential (primary) hypertension: Secondary | ICD-10-CM | POA: Diagnosis not present

## 2023-07-25 DIAGNOSIS — I10 Essential (primary) hypertension: Secondary | ICD-10-CM | POA: Diagnosis not present

## 2023-07-25 DIAGNOSIS — I739 Peripheral vascular disease, unspecified: Secondary | ICD-10-CM | POA: Diagnosis not present

## 2023-07-25 DIAGNOSIS — N400 Enlarged prostate without lower urinary tract symptoms: Secondary | ICD-10-CM | POA: Diagnosis not present

## 2023-07-25 DIAGNOSIS — E1169 Type 2 diabetes mellitus with other specified complication: Secondary | ICD-10-CM | POA: Diagnosis not present

## 2023-08-02 DIAGNOSIS — E1169 Type 2 diabetes mellitus with other specified complication: Secondary | ICD-10-CM | POA: Diagnosis not present

## 2023-08-02 DIAGNOSIS — I739 Peripheral vascular disease, unspecified: Secondary | ICD-10-CM | POA: Diagnosis not present

## 2023-08-02 DIAGNOSIS — I10 Essential (primary) hypertension: Secondary | ICD-10-CM | POA: Diagnosis not present

## 2023-08-03 DIAGNOSIS — L570 Actinic keratosis: Secondary | ICD-10-CM | POA: Diagnosis not present

## 2023-08-03 DIAGNOSIS — D692 Other nonthrombocytopenic purpura: Secondary | ICD-10-CM | POA: Diagnosis not present

## 2023-08-03 DIAGNOSIS — L821 Other seborrheic keratosis: Secondary | ICD-10-CM | POA: Diagnosis not present

## 2023-08-03 DIAGNOSIS — L905 Scar conditions and fibrosis of skin: Secondary | ICD-10-CM | POA: Diagnosis not present

## 2023-08-03 DIAGNOSIS — L814 Other melanin hyperpigmentation: Secondary | ICD-10-CM | POA: Diagnosis not present

## 2023-08-03 DIAGNOSIS — Z85828 Personal history of other malignant neoplasm of skin: Secondary | ICD-10-CM | POA: Diagnosis not present

## 2023-08-03 DIAGNOSIS — D225 Melanocytic nevi of trunk: Secondary | ICD-10-CM | POA: Diagnosis not present

## 2023-08-25 DIAGNOSIS — E1169 Type 2 diabetes mellitus with other specified complication: Secondary | ICD-10-CM | POA: Diagnosis not present

## 2023-08-25 DIAGNOSIS — N400 Enlarged prostate without lower urinary tract symptoms: Secondary | ICD-10-CM | POA: Diagnosis not present

## 2023-08-25 DIAGNOSIS — I739 Peripheral vascular disease, unspecified: Secondary | ICD-10-CM | POA: Diagnosis not present

## 2023-08-25 DIAGNOSIS — I10 Essential (primary) hypertension: Secondary | ICD-10-CM | POA: Diagnosis not present

## 2023-09-01 DIAGNOSIS — I739 Peripheral vascular disease, unspecified: Secondary | ICD-10-CM | POA: Diagnosis not present

## 2023-09-01 DIAGNOSIS — I10 Essential (primary) hypertension: Secondary | ICD-10-CM | POA: Diagnosis not present

## 2023-09-01 DIAGNOSIS — E1169 Type 2 diabetes mellitus with other specified complication: Secondary | ICD-10-CM | POA: Diagnosis not present

## 2023-09-22 DIAGNOSIS — Z23 Encounter for immunization: Secondary | ICD-10-CM | POA: Diagnosis not present

## 2023-09-25 DIAGNOSIS — I10 Essential (primary) hypertension: Secondary | ICD-10-CM | POA: Diagnosis not present

## 2023-09-25 DIAGNOSIS — N400 Enlarged prostate without lower urinary tract symptoms: Secondary | ICD-10-CM | POA: Diagnosis not present

## 2023-09-25 DIAGNOSIS — I739 Peripheral vascular disease, unspecified: Secondary | ICD-10-CM | POA: Diagnosis not present

## 2023-09-25 DIAGNOSIS — E1169 Type 2 diabetes mellitus with other specified complication: Secondary | ICD-10-CM | POA: Diagnosis not present

## 2023-10-01 DIAGNOSIS — I10 Essential (primary) hypertension: Secondary | ICD-10-CM | POA: Diagnosis not present

## 2023-10-01 DIAGNOSIS — I739 Peripheral vascular disease, unspecified: Secondary | ICD-10-CM | POA: Diagnosis not present

## 2023-10-01 DIAGNOSIS — E1169 Type 2 diabetes mellitus with other specified complication: Secondary | ICD-10-CM | POA: Diagnosis not present

## 2023-10-11 DIAGNOSIS — Z23 Encounter for immunization: Secondary | ICD-10-CM | POA: Diagnosis not present

## 2023-10-25 DIAGNOSIS — E1169 Type 2 diabetes mellitus with other specified complication: Secondary | ICD-10-CM | POA: Diagnosis not present

## 2023-10-25 DIAGNOSIS — N400 Enlarged prostate without lower urinary tract symptoms: Secondary | ICD-10-CM | POA: Diagnosis not present

## 2023-10-25 DIAGNOSIS — I10 Essential (primary) hypertension: Secondary | ICD-10-CM | POA: Diagnosis not present

## 2023-10-25 DIAGNOSIS — I739 Peripheral vascular disease, unspecified: Secondary | ICD-10-CM | POA: Diagnosis not present

## 2023-10-31 DIAGNOSIS — E1169 Type 2 diabetes mellitus with other specified complication: Secondary | ICD-10-CM | POA: Diagnosis not present

## 2023-10-31 DIAGNOSIS — I10 Essential (primary) hypertension: Secondary | ICD-10-CM | POA: Diagnosis not present

## 2023-10-31 DIAGNOSIS — I739 Peripheral vascular disease, unspecified: Secondary | ICD-10-CM | POA: Diagnosis not present

## 2023-11-15 DIAGNOSIS — J0100 Acute maxillary sinusitis, unspecified: Secondary | ICD-10-CM | POA: Diagnosis not present

## 2023-11-15 DIAGNOSIS — J069 Acute upper respiratory infection, unspecified: Secondary | ICD-10-CM | POA: Diagnosis not present

## 2023-11-25 DIAGNOSIS — I739 Peripheral vascular disease, unspecified: Secondary | ICD-10-CM | POA: Diagnosis not present

## 2023-11-25 DIAGNOSIS — E1169 Type 2 diabetes mellitus with other specified complication: Secondary | ICD-10-CM | POA: Diagnosis not present

## 2023-11-25 DIAGNOSIS — I10 Essential (primary) hypertension: Secondary | ICD-10-CM | POA: Diagnosis not present

## 2023-11-25 DIAGNOSIS — N400 Enlarged prostate without lower urinary tract symptoms: Secondary | ICD-10-CM | POA: Diagnosis not present

## 2023-11-30 DIAGNOSIS — I10 Essential (primary) hypertension: Secondary | ICD-10-CM | POA: Diagnosis not present

## 2023-11-30 DIAGNOSIS — E1169 Type 2 diabetes mellitus with other specified complication: Secondary | ICD-10-CM | POA: Diagnosis not present

## 2023-11-30 DIAGNOSIS — I739 Peripheral vascular disease, unspecified: Secondary | ICD-10-CM | POA: Diagnosis not present

## 2023-12-25 DIAGNOSIS — N400 Enlarged prostate without lower urinary tract symptoms: Secondary | ICD-10-CM | POA: Diagnosis not present

## 2023-12-25 DIAGNOSIS — I10 Essential (primary) hypertension: Secondary | ICD-10-CM | POA: Diagnosis not present

## 2023-12-25 DIAGNOSIS — I739 Peripheral vascular disease, unspecified: Secondary | ICD-10-CM | POA: Diagnosis not present

## 2023-12-25 DIAGNOSIS — E1169 Type 2 diabetes mellitus with other specified complication: Secondary | ICD-10-CM | POA: Diagnosis not present

## 2024-01-11 DIAGNOSIS — Z125 Encounter for screening for malignant neoplasm of prostate: Secondary | ICD-10-CM | POA: Diagnosis not present

## 2024-01-11 DIAGNOSIS — E1169 Type 2 diabetes mellitus with other specified complication: Secondary | ICD-10-CM | POA: Diagnosis not present

## 2024-01-11 DIAGNOSIS — K50019 Crohn's disease of small intestine with unspecified complications: Secondary | ICD-10-CM | POA: Diagnosis not present

## 2024-01-13 ENCOUNTER — Ambulatory Visit: Admitting: Podiatry

## 2024-01-13 DIAGNOSIS — M79675 Pain in left toe(s): Secondary | ICD-10-CM

## 2024-01-13 DIAGNOSIS — E1149 Type 2 diabetes mellitus with other diabetic neurological complication: Secondary | ICD-10-CM

## 2024-01-13 DIAGNOSIS — M79674 Pain in right toe(s): Secondary | ICD-10-CM

## 2024-01-13 DIAGNOSIS — L600 Ingrowing nail: Secondary | ICD-10-CM

## 2024-01-13 NOTE — Patient Instructions (Signed)

## 2024-01-13 NOTE — Progress Notes (Unsigned)
 Right 4th growin g down at end
